# Patient Record
Sex: Male | Born: 1980 | Race: White | Hispanic: No | Marital: Married
Health system: Midwestern US, Academic
[De-identification: ages and names within clinical notes are randomized; demographics above are authoritative.]

---

## 2013-08-27 IMAGING — CR CHEST
2 series · 2 of 2 positions shown · non-contrast
Comparison: February 26, 2012
COMPARISON: February 26, 2012

------------- REPORT [id number] -------------
CHEST 2 VIEWS
INDICATION: Chest pain and palpitations
TECHNIQUE: PA and lateral projections.

[chest pa x-wise]
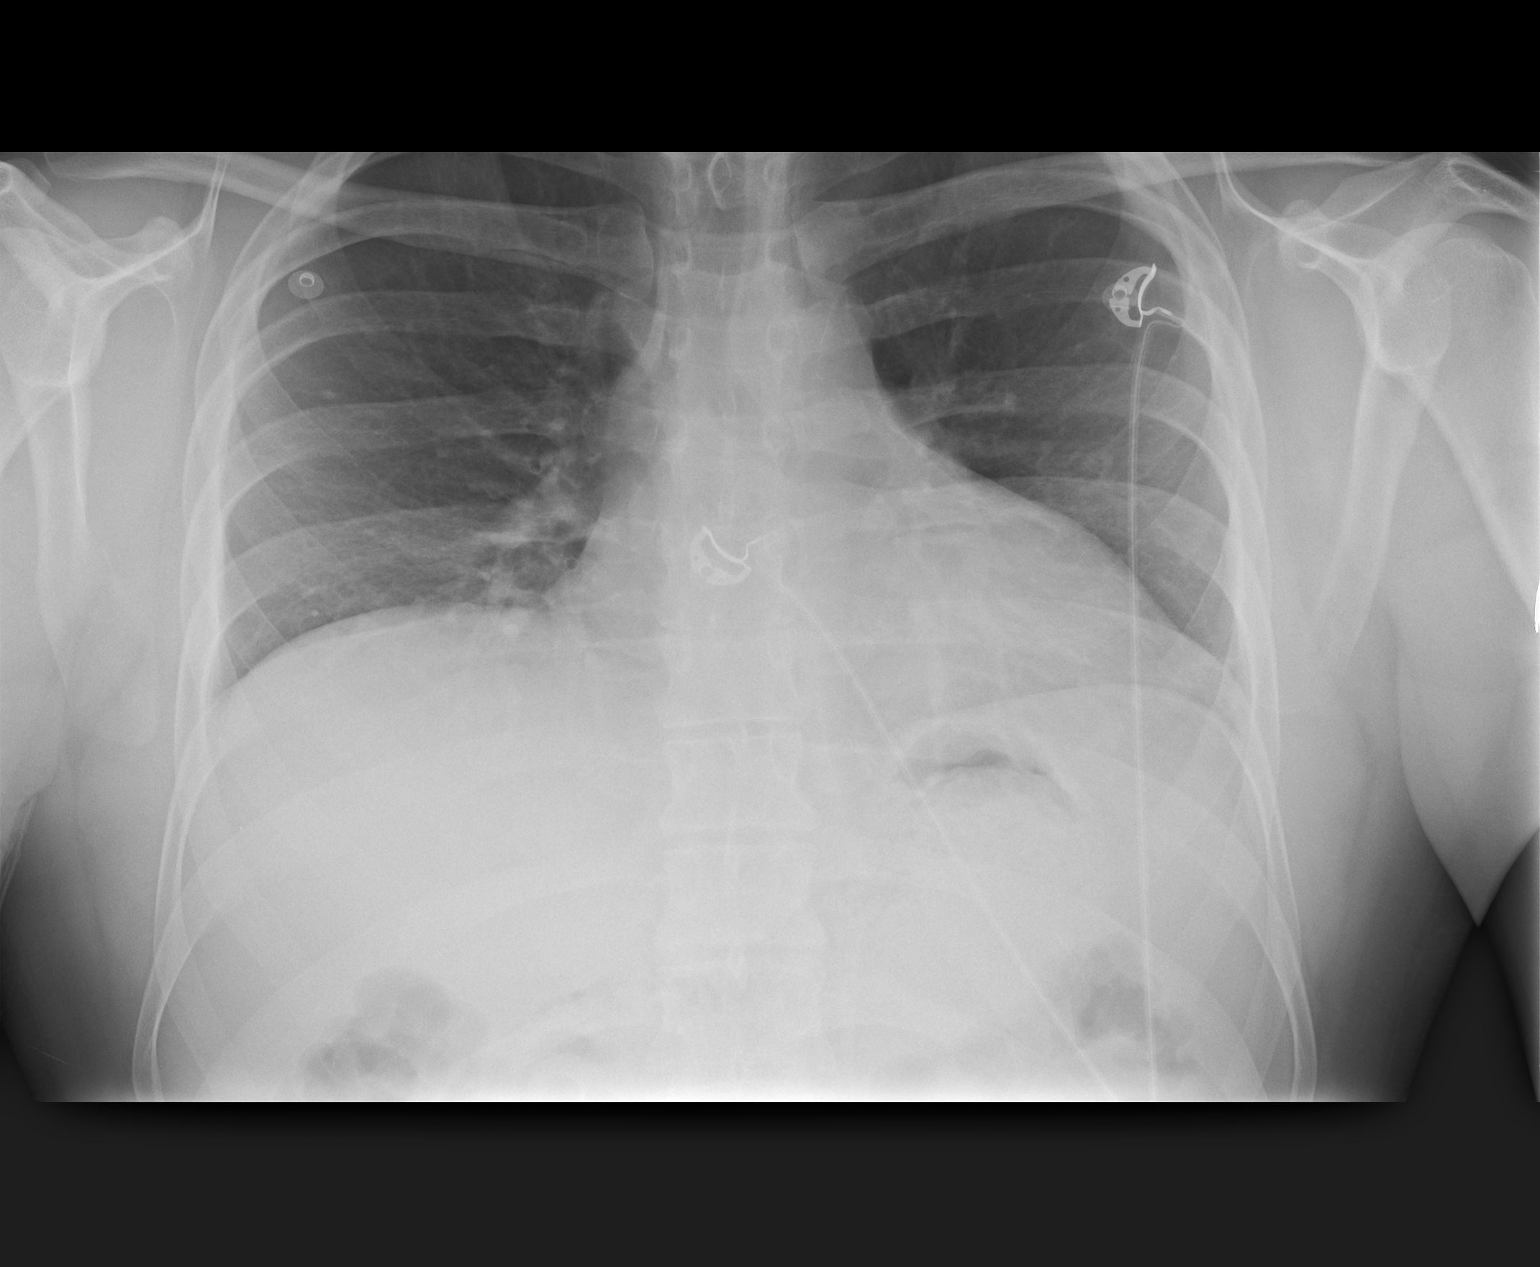

[chest lat]
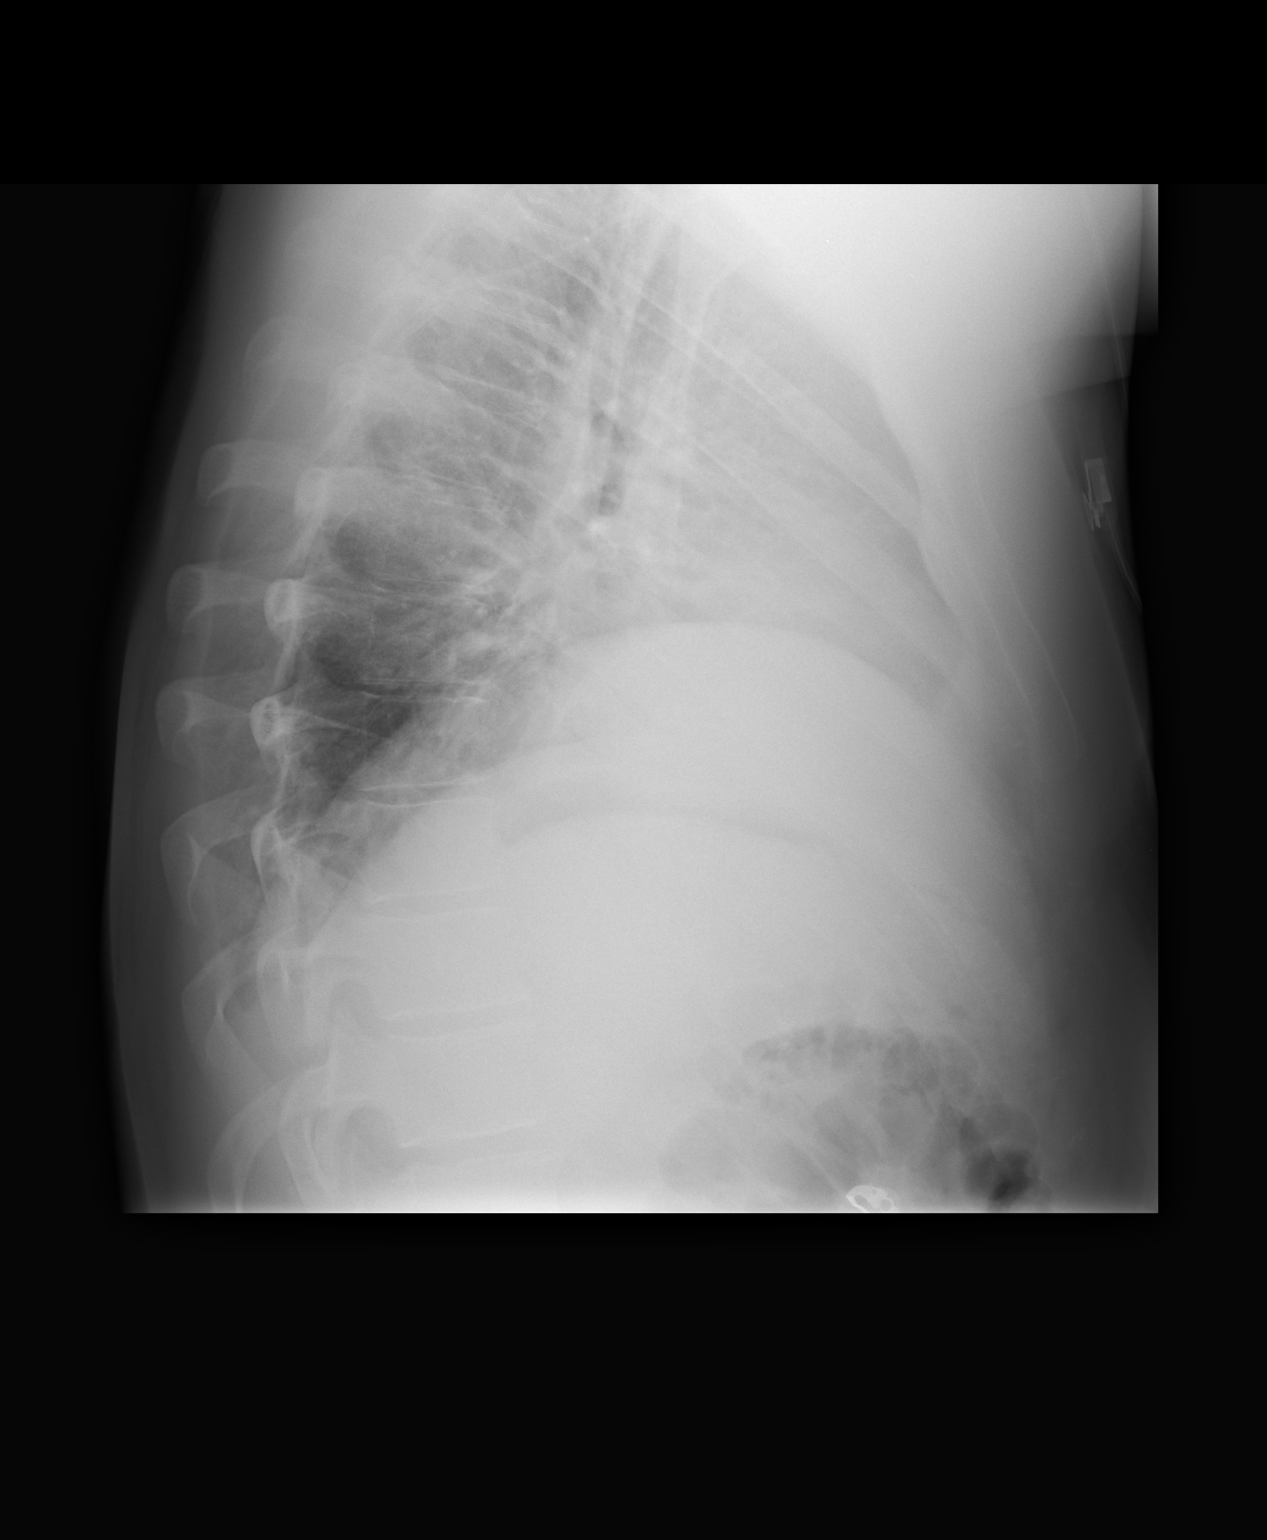

[2 of 2 positions shown; findings below may reference images not displayed]

FINDINGS: There is very shallow inspiration.
The lung fields reveal no active process..
The heart and mediastinum are physiologic in size and contour
IMPRESSION: No active process

Tech Notes: PT STATES HAS CHEST PAIN, SOA X 1 DAY. HX OF TACHYCARDIA, MEDS STARTED X 1 WKS AGO. PT
SHIELDED. TJ/ME

------------- REPORT [id number] -------------
CHEST 2 VIEWS
FINDINGS: There is very shallow inspiration.
The lung fields reveal no active process..
The heart and mediastinum are physiologic in size and contour
IMPRESSION: No active process

Tech Notes: PT STATES HAS CHEST PAIN, SOA X 1 DAY. HX OF TACHYCARDIA, MEDS STARTED X 1 WKS AGO. PT
SHIELDED. TJ/ME

## 2013-12-17 IMAGING — CR CHEST
1 series · 1 of 1 positions shown · non-contrast
Comparison: none

EXAM:  FRONTAL VIEW CHEST
HISTORY: Hypertension.

[chest pa x-wise]
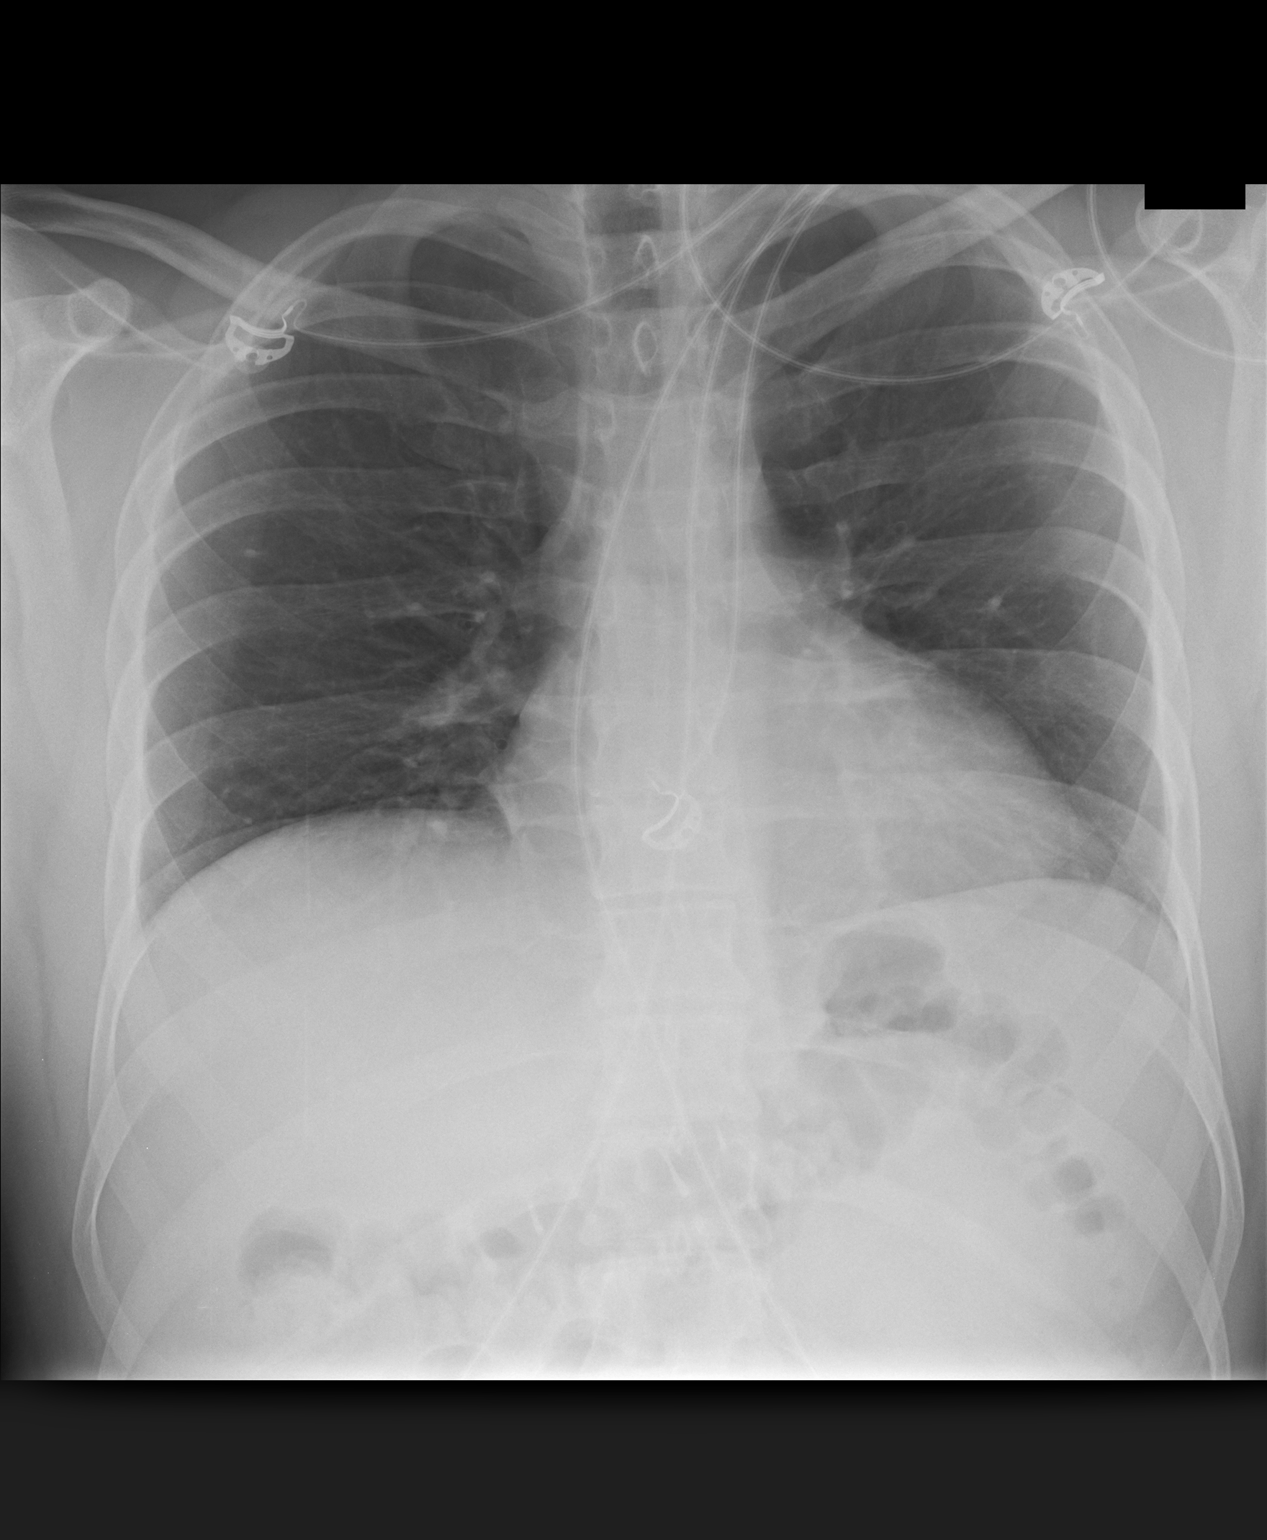

[1 of 1 positions shown; findings below may reference images not displayed]

FINDINGS: Examination 17 December, 0216 hours.

Normal cardiac silhouette and pulmonary vasculature.  Lungs well-aerated and
well-inflated.
IMPRESSION: Negative.

Job:  [id number]

Tech Notes: PT. C/O BLURRED VISION ACCOMPANIED BY HEADACHE X3 DAYS, HYPERTENSION, MK

## 2014-03-06 IMAGING — CR UP_EXM
3 series · 3 of 3 positions shown · non-contrast
Comparison: None available

EXAM: Right hand
INDICATION: Right hand swelling in the region of the first MCP joint.
No known trauma
TECHNIQUE: Three standard projections.

[hand lat]
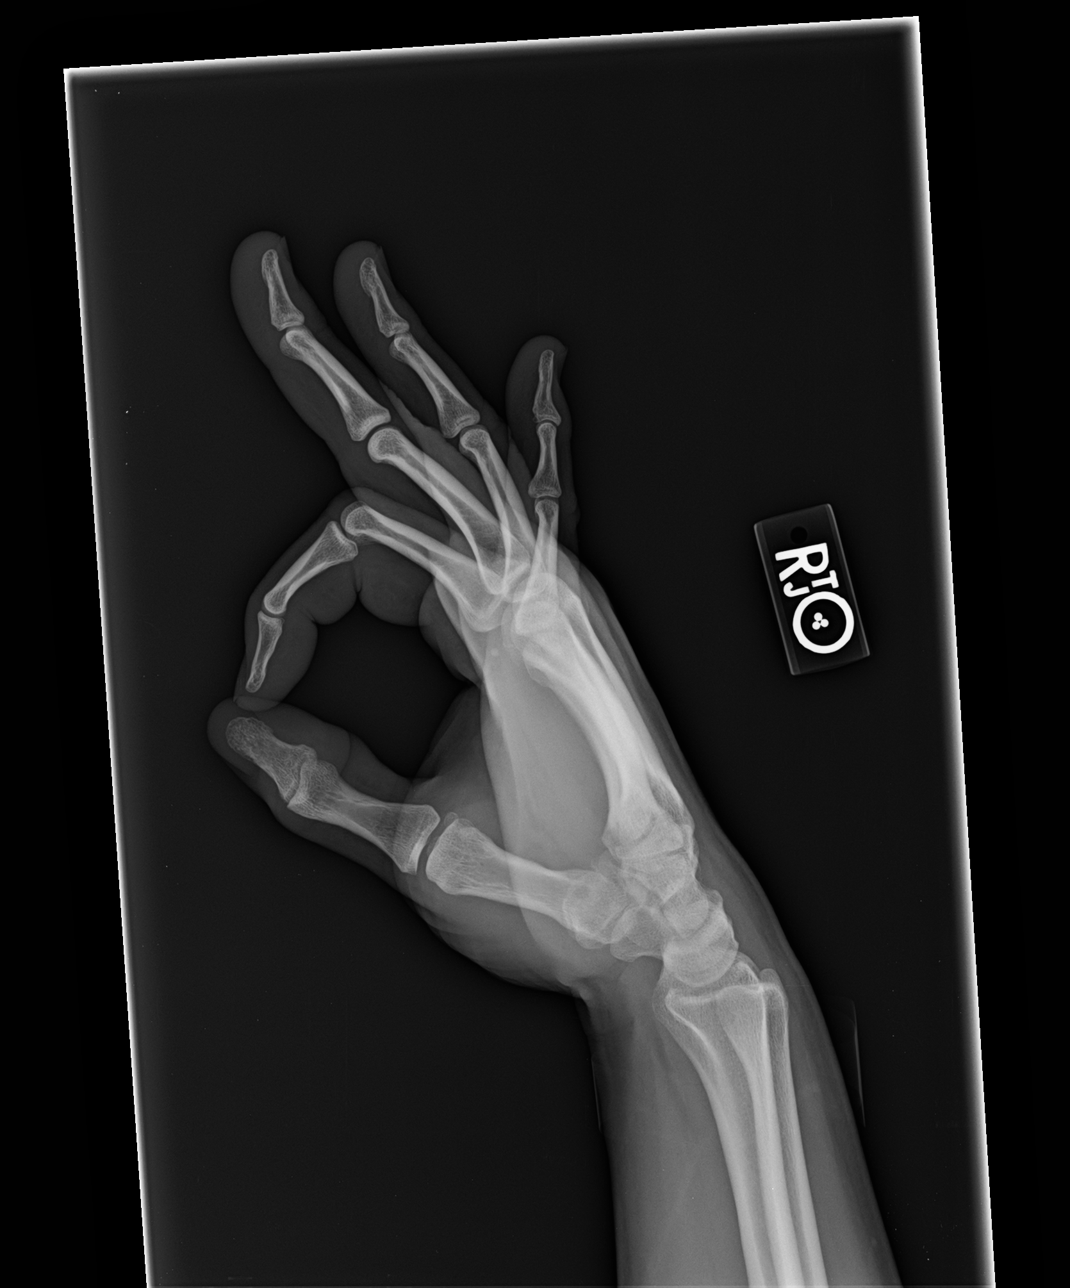

[hand obl]
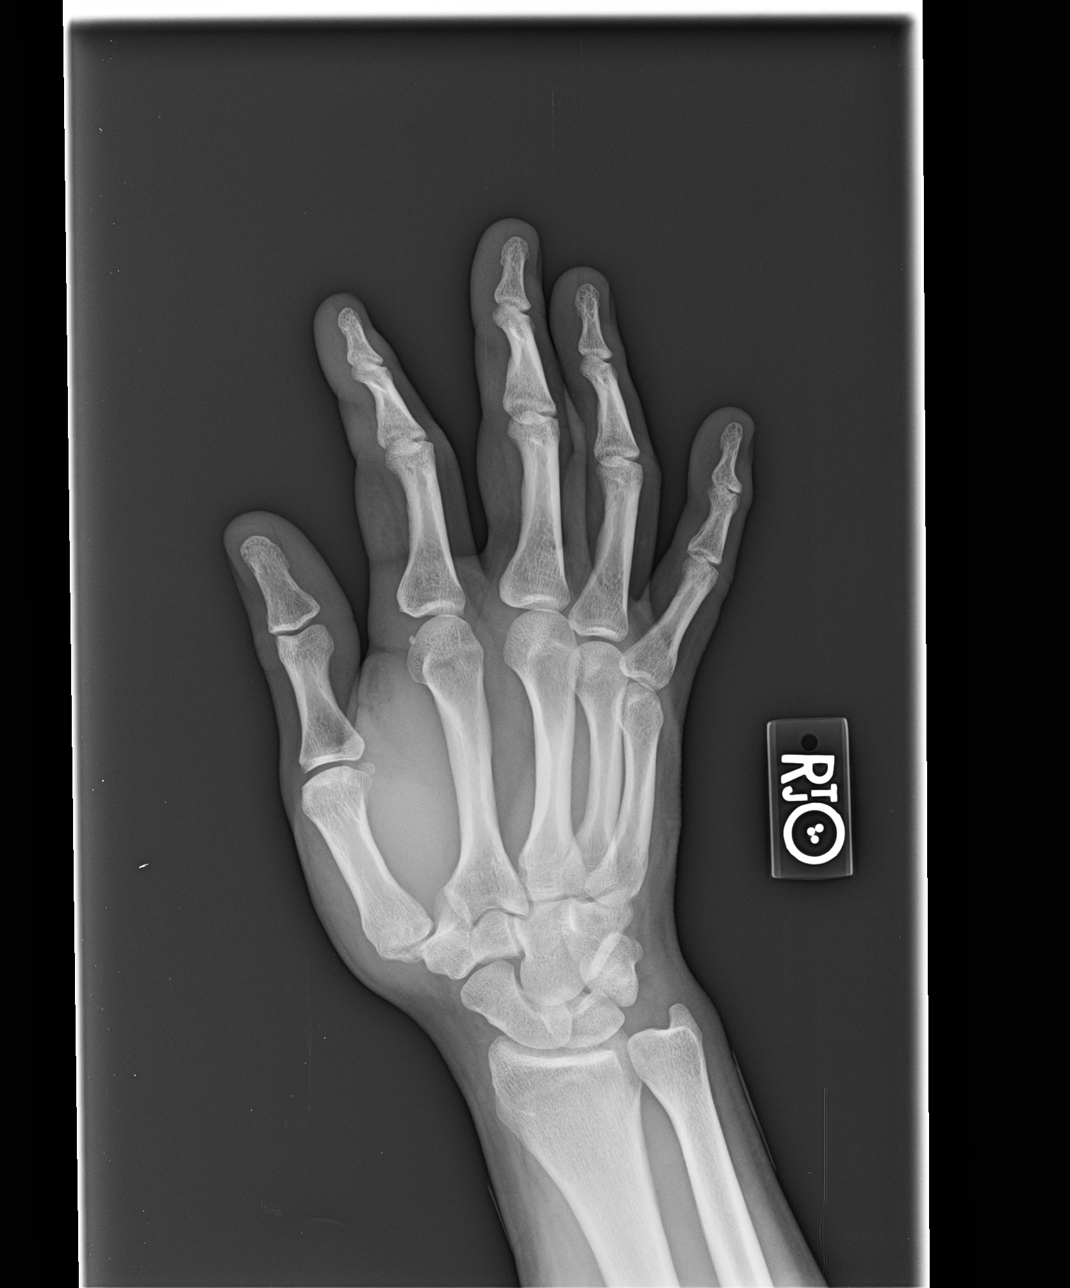

[hand]
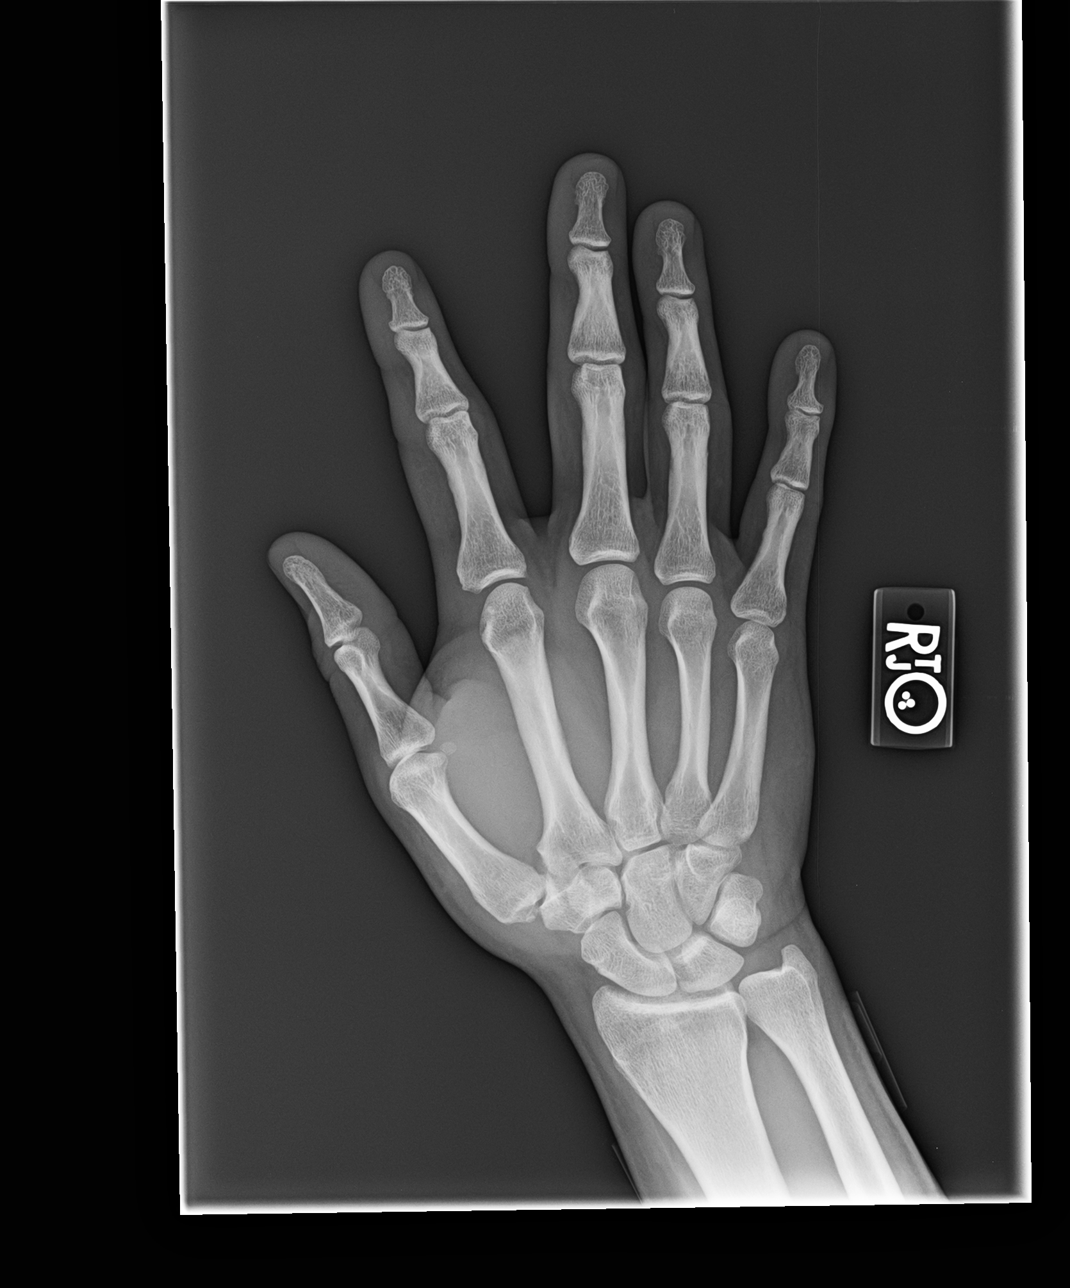

[3 of 3 positions shown; findings below may reference images not displayed]

IMPRESSION: Soft tissue thickening of the thenar eminence and the bases of the first and
second digits.
Mild arthrosis of the first metacarpal trapezium articulation.
No acute bony abnormality
FINDINGS: There is no fracture or dislocation.
There is mild lipping of the proximal inferior surface of the radius.
There is soft tissue thickening of the thenar eminence and bases of the first
and second digits
No other significant localized bony changes seenreveal no focal abnormality

y

Tech Notes: PT C/O RIGHT HAND, 1ST MCP SWELLING X LAST PM NO KNOWN INJURY. TJ

## 2014-05-06 IMAGING — CR LOW_EXM
2 series · 2 of 2 positions shown · non-contrast
Comparison: None

EXAM: Left foot
INDICATION: Lateral with meter 2 years
TECHNIQUE: AP and lateral projections.

[foot]
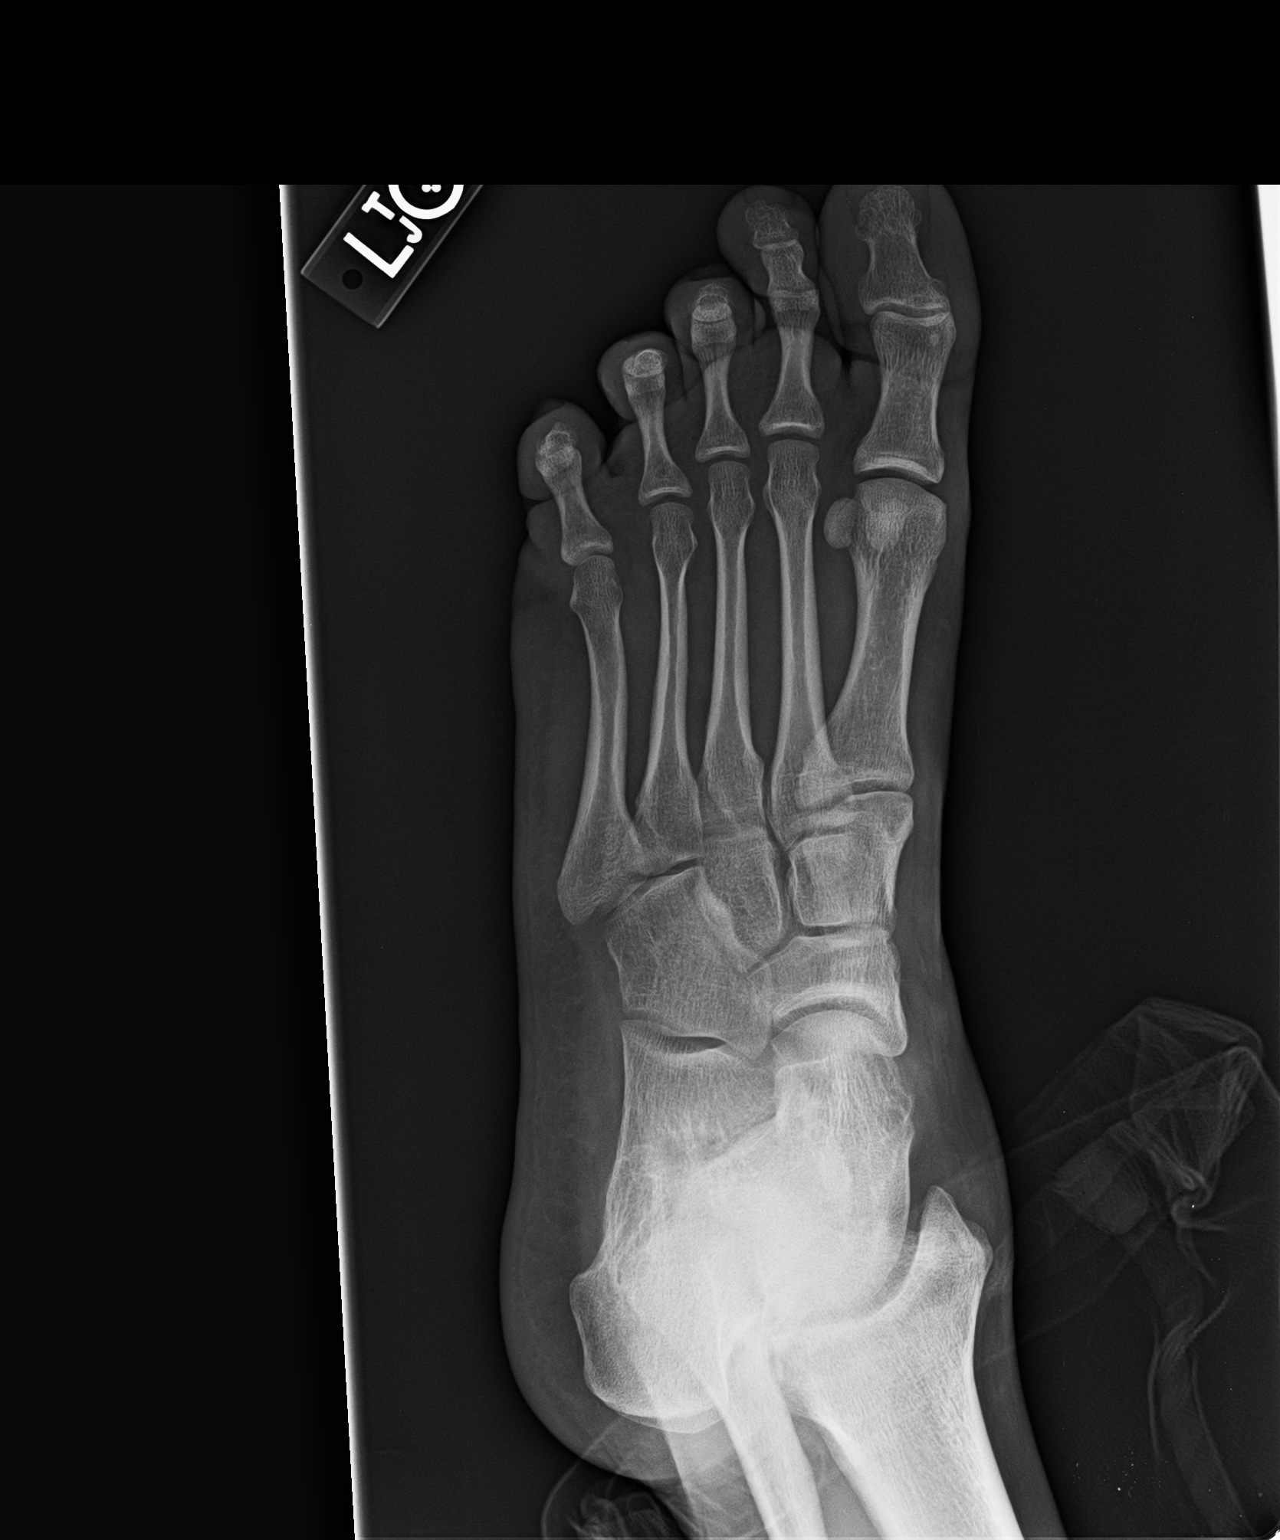

[foot lat]
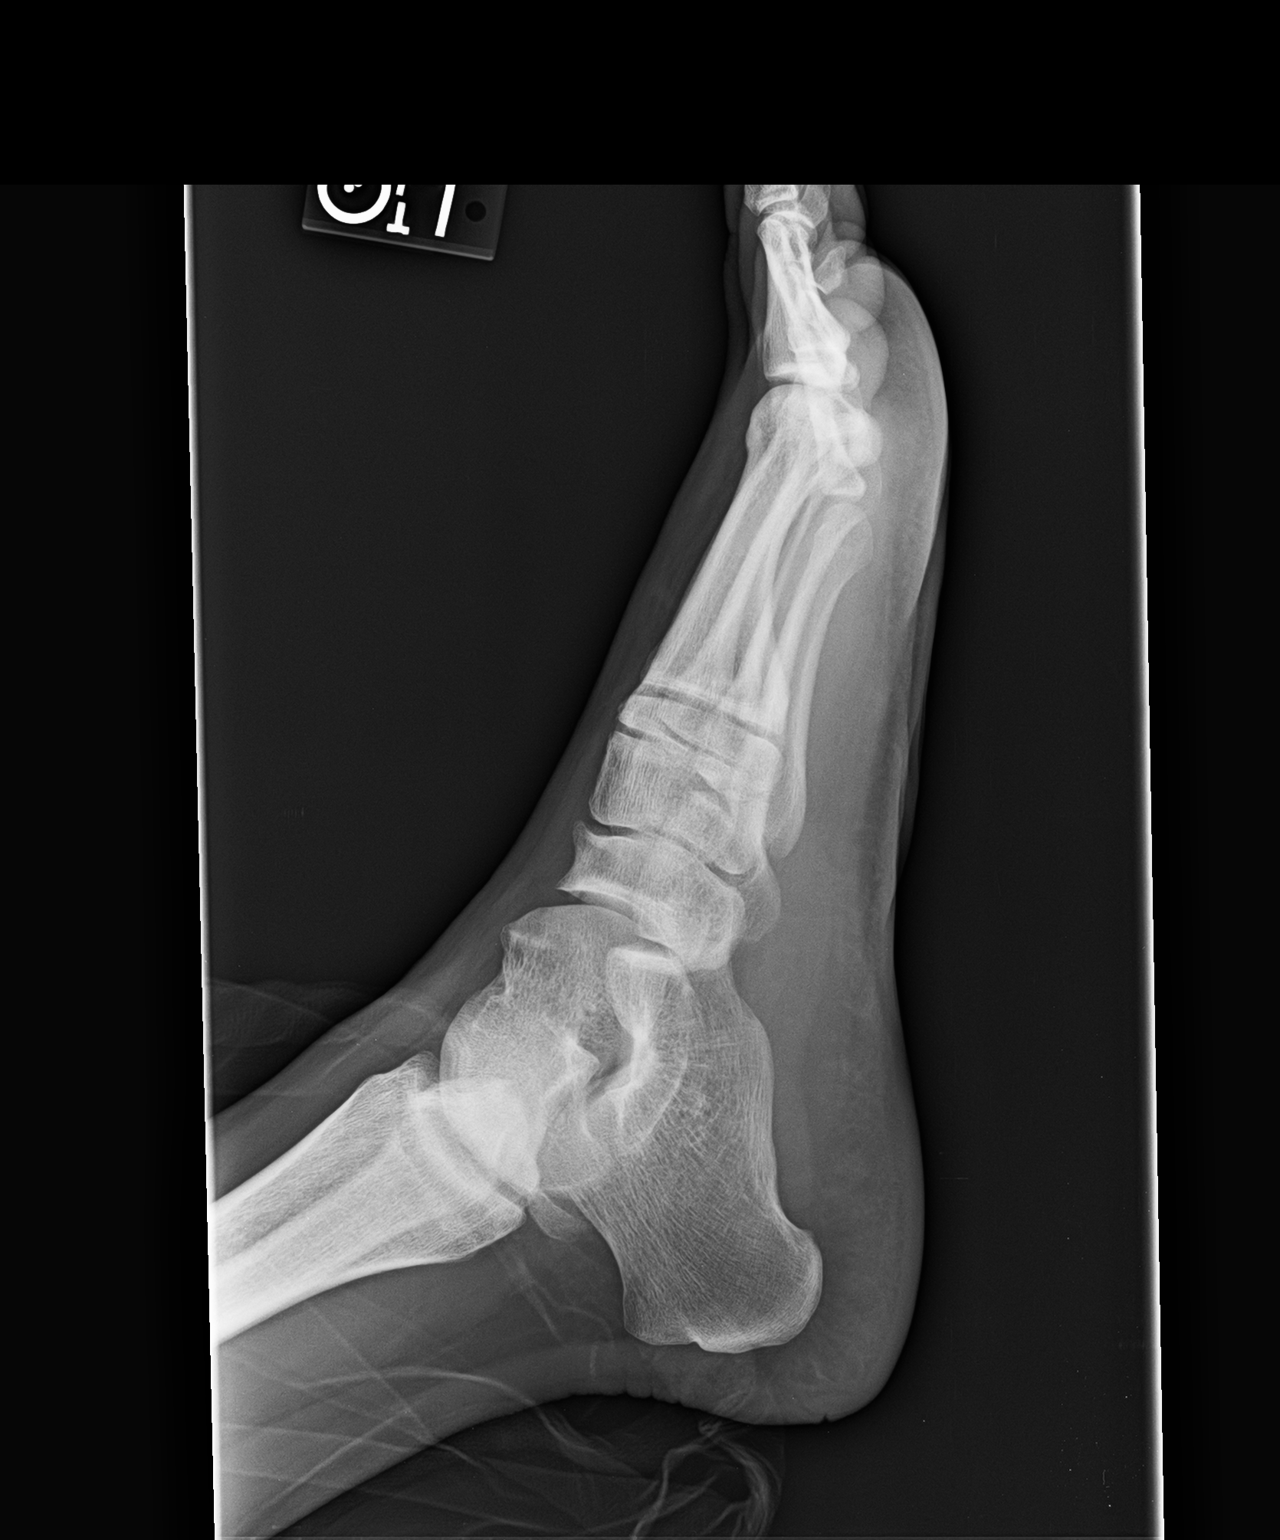

[2 of 2 positions shown; findings below may reference images not displayed]

IMPRESSION: No acute bony process.
FINDINGS: There is no fracture or dislocation.
There are no significant localized bony change.
The joint spaces are preserved.
The soft tissue silhouettes reveal no focal abnormality

Tech Notes: PT STATES HAS LEFT LATERAL FOOT PAIN X 2 YEARS. INCREASING PAIN. NO KNOWN INJURY. PT
SHIELDED. TJ

## 2015-02-14 IMAGING — CT Abdomen^1_KIDNEY_STONE (Adult)
1 series · 15 of 32 positions shown, 19 images · non-contrast
Comparison: none

PROCEDURE: ABDOMEN 1_KIDNEY_STONE (ADULT)
HISTORY: Left flank pain that radaites into left groin region x 3 days- nausea- Pt denies vomiting
and
diarrhea- DLP [id number]
TECHNIQUE: Noncontrast CT images of the abdomen and pelvis with additional sagittal and coronal
reformatted views. Prior study not available for comparison.

[Series 2: kidney stone 3.0 soft tissue · axial · 0.88mm/px · z∈[-534,-42]mm · 15 of 182 slices shown, 19 images]
[im 12/182  soft-tissue]
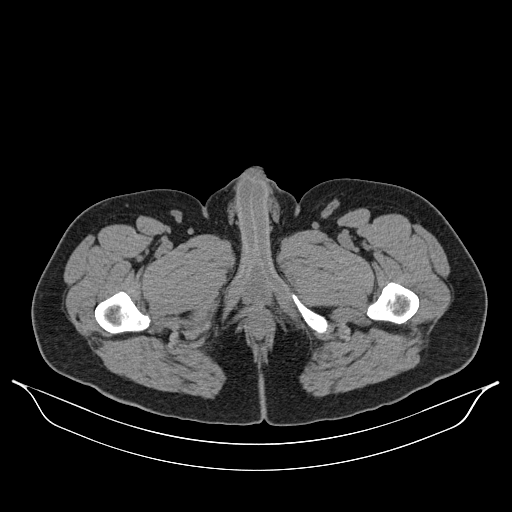
[im 12/182  bone]
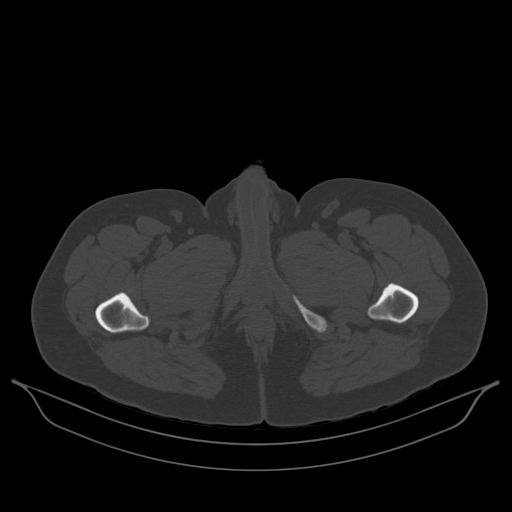
[im 24/182  soft-tissue]
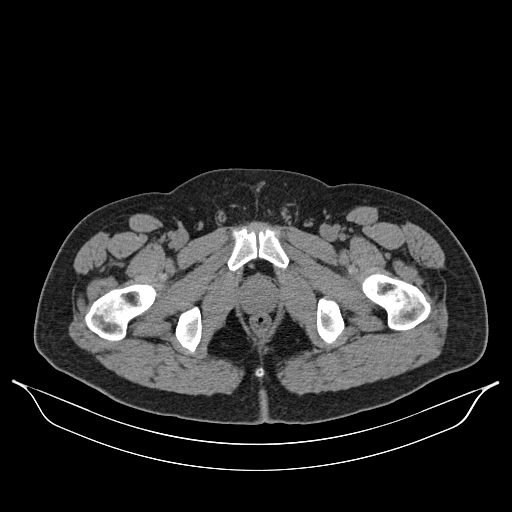
[im 36/182  soft-tissue]
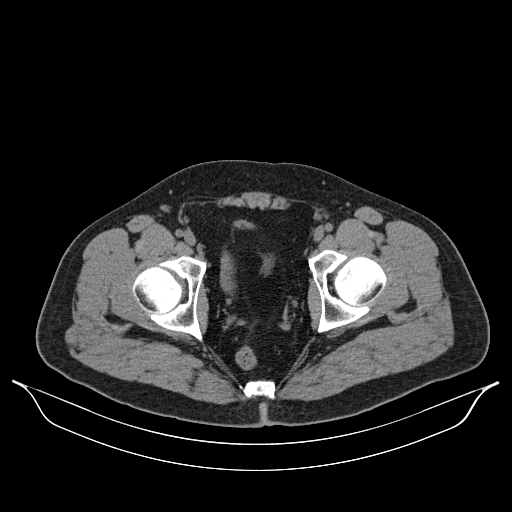
[im 53/182  soft-tissue]
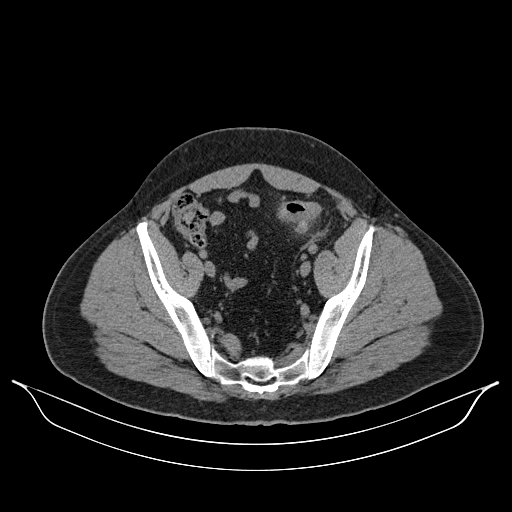
[im 65/182  soft-tissue]
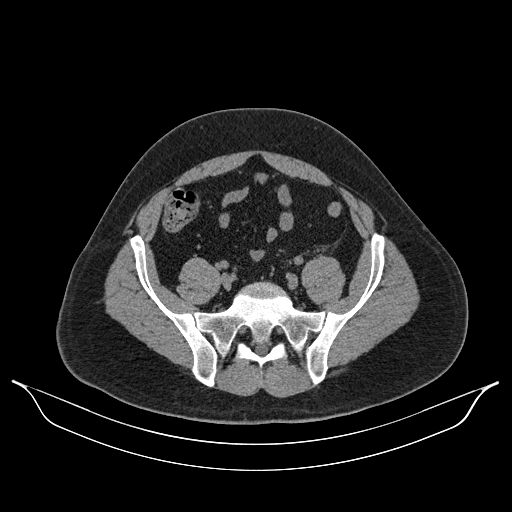
[im 76/182  soft-tissue]
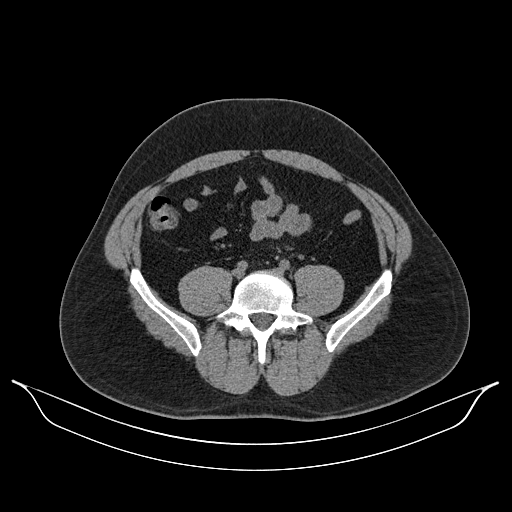
[im 94/182  soft-tissue]
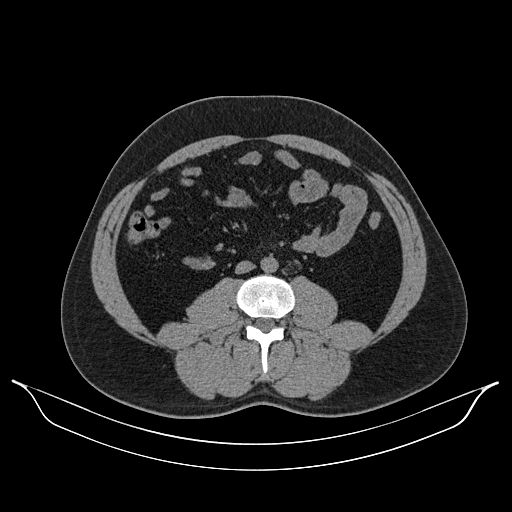
[im 106/182  soft-tissue]
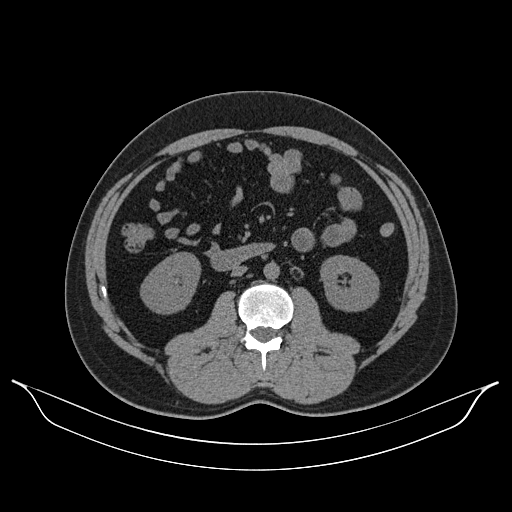
[im 117/182  soft-tissue]
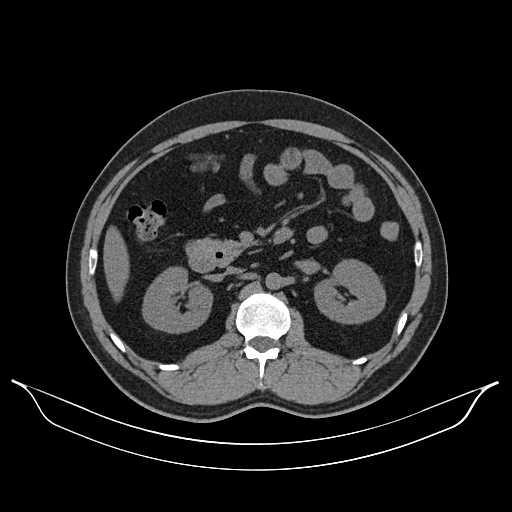
[im 117/182  bone]
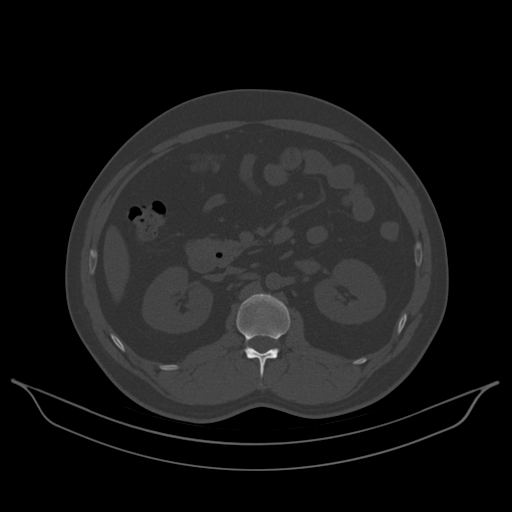
[im 129/182  soft-tissue]
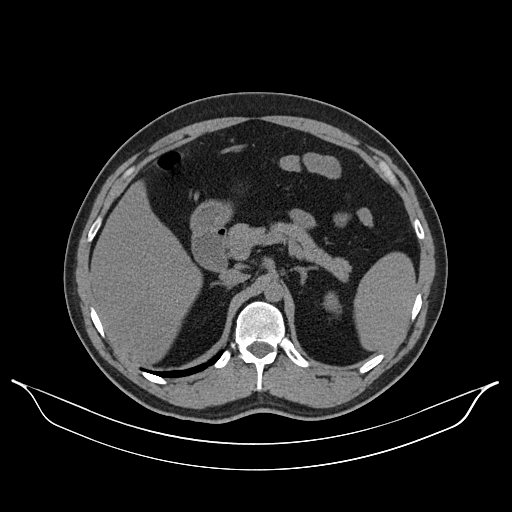
[im 146/182  soft-tissue]
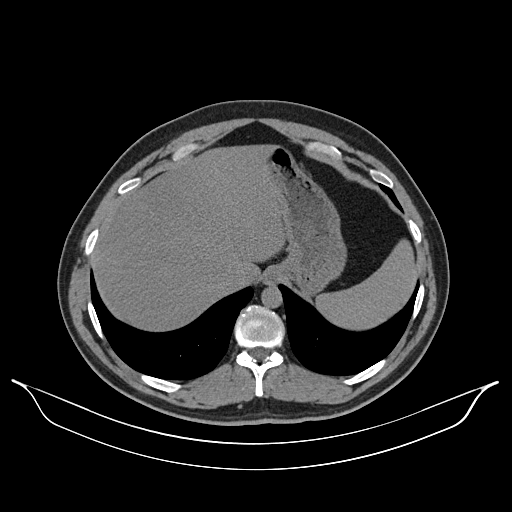
[im 158/182  soft-tissue]
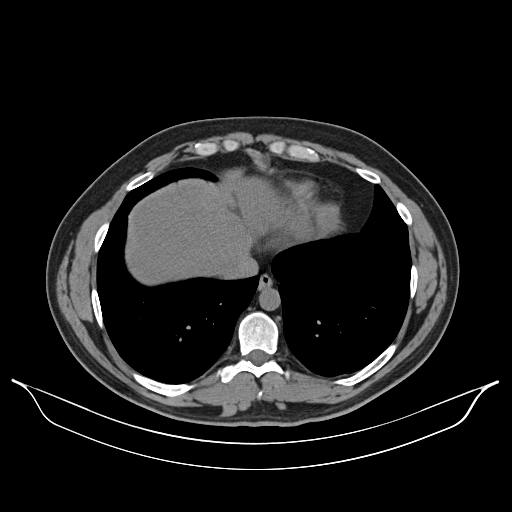
[im 158/182  lung]
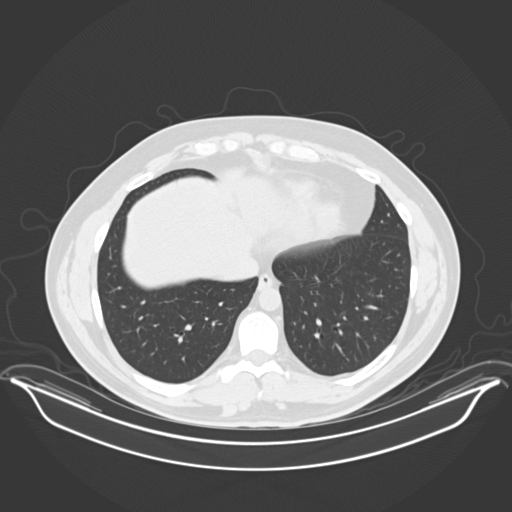
[im 164/182  lung]
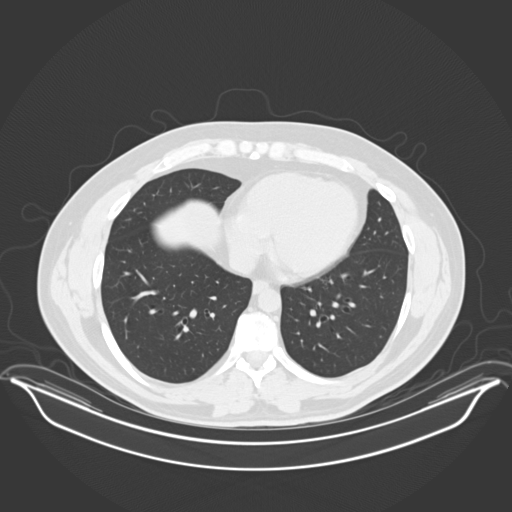
[im 170/182  soft-tissue]
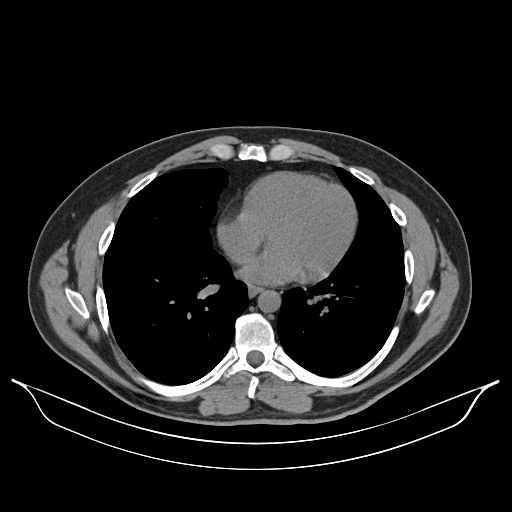
[im 170/182  lung]
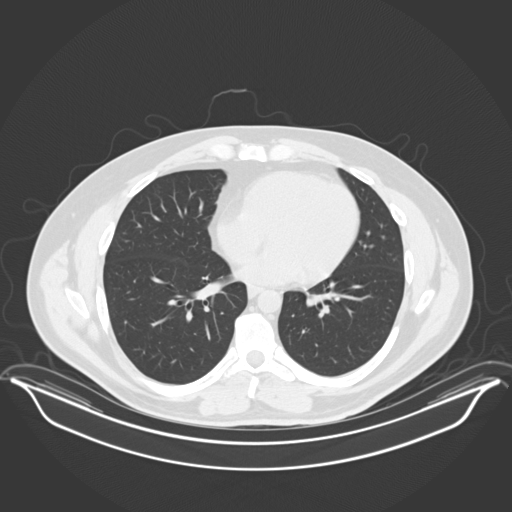
[im 176/182  lung]
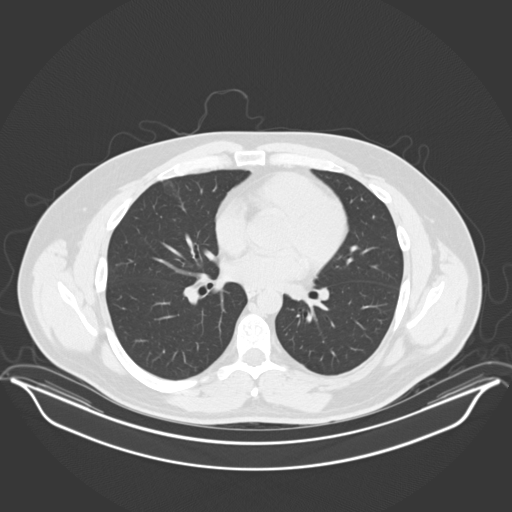

[15 of 32 positions shown; findings below may reference images not displayed]

FINDINGS: The visualized lung bases are clear.
Liver diffusely decreased in attenuation. The noncontrast appearance of the spleen, pancreas,
gallbladder, and adrenal glands is unremarkable.
Kidneys unremarkable without stone disease or hydronephrosis. Ureters normal in caliber.
Solid organ mass would be difficult to exclude without contrast.
There is no abdominal or pelvic lymphadenopathy or ascites.
Short segment of proximal sigmoid with wall thickening and surrounding inflammatory stranding,
diverticula noted in this location. Remaining bowel demonstrates no evidence of obstruction. No
mesenteric r mass. No free intraperitoneal gas.
Evaluation of the pelvis reveals a normal appearing urinary bladder.
There are no suspicious lytic or blastic osseous lesions.
IMPRESSION: 1. Findings of a mild uncomplicated sigmoid diverticulitis. No evidence of abscess or perforation.
2. Otherwise unremarkable noncontrast CT scan of the abdomen and pelvis.

Tech Notes: Left flank pain and groin region x 3 days- Nausea- Pt denies diarrhea and vomiting

## 2015-04-22 IMAGING — NM NM HEPATOBILIARY DUCTAL SYSTEM
1 series · 6 of 6 positions shown · non-contrast
Comparison: none

[flow · 4.52mm/px · 6 of 49 frames shown]
[frame 5/49]
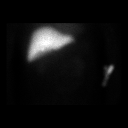
[frame 13/49]
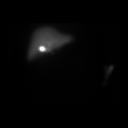
[frame 21/49]
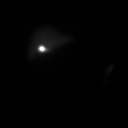
[frame 29/49]
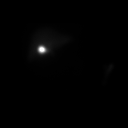
[frame 37/49]
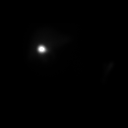
[frame 45/49]
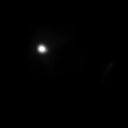

[6 of 6 positions shown; findings below may reference images not displayed]

NUCLEAR MEDICINE REPORT

EXAM
Hepatobiliary scan.

INDICATION
upper abd pain, diarrhea
5.3 mCi TcRRm MEBROFENIN @ 2639
1.8 micrograms KINEVAC-PT EXPERIENCED EPIGASTRIC PAIN AND NAUSEA DURING
INJECTION.
LLQ PAIN, DIARRHEA AND METALLIC TASTE IN HIS MOUTH X 2 MONTHS. ELEVATED
BILIRUBIN AND LIVER ENZYMES.(..)

FINDINGS
Prompt hepatic extraction and excretion of radiopharmaceutical. The gallbladder is initially
imaged at 4 minutes and continues to fill over the remaining hour. The gallbladder ejection
fraction is normal and calculated at 92 percent at 28 minutes.

IMPRESSION
Normal hepatobiliary scan and gallbladder function.

## 2015-04-22 IMAGING — US US ABDOMINAL COMPLETE
1 series · 14 of 25 positions shown · non-contrast
Comparison: none

[Series 1: us abdominal complete · 14 of 64 slices shown]
[im 1/64]
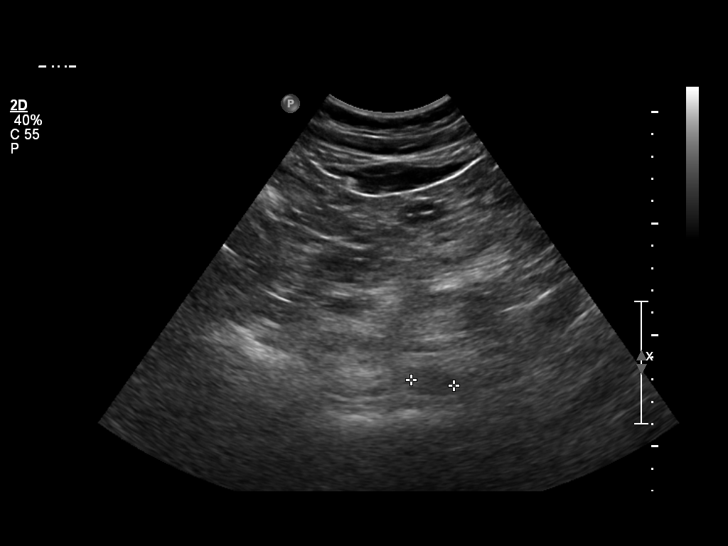
[im 6/64]
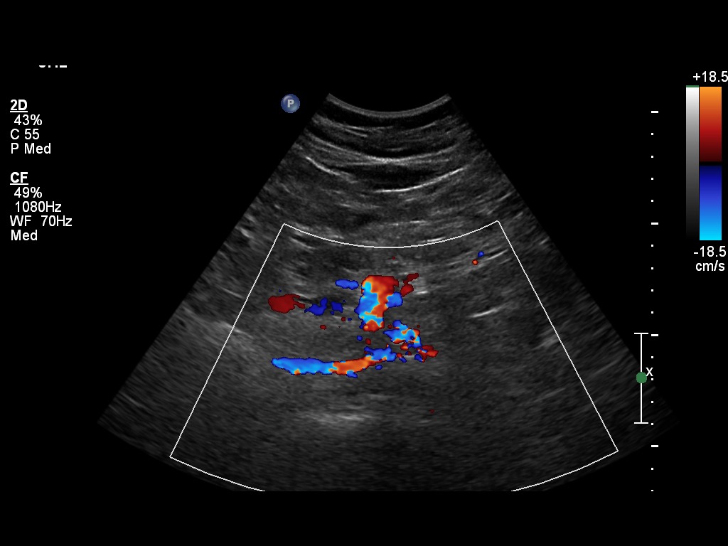
[im 11/64]
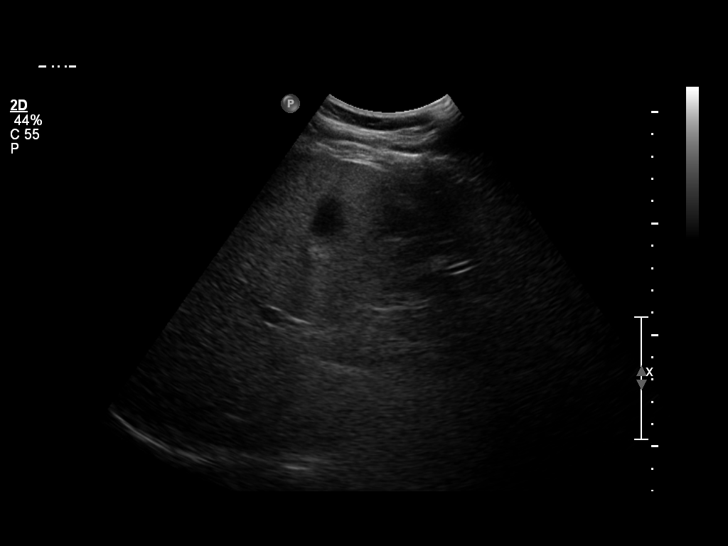
[im 16/64]
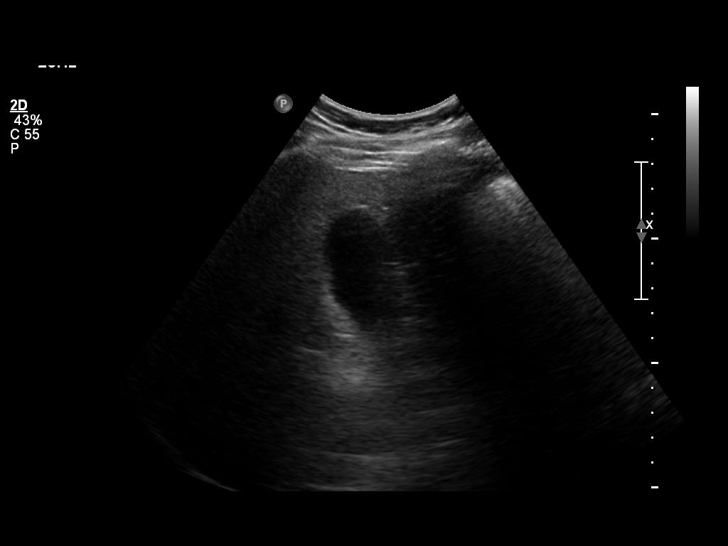
[im 22/64]
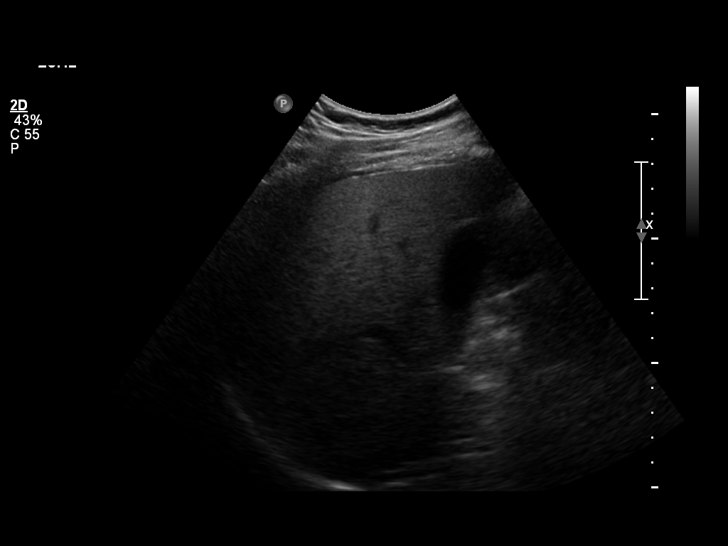
[im 24/64]
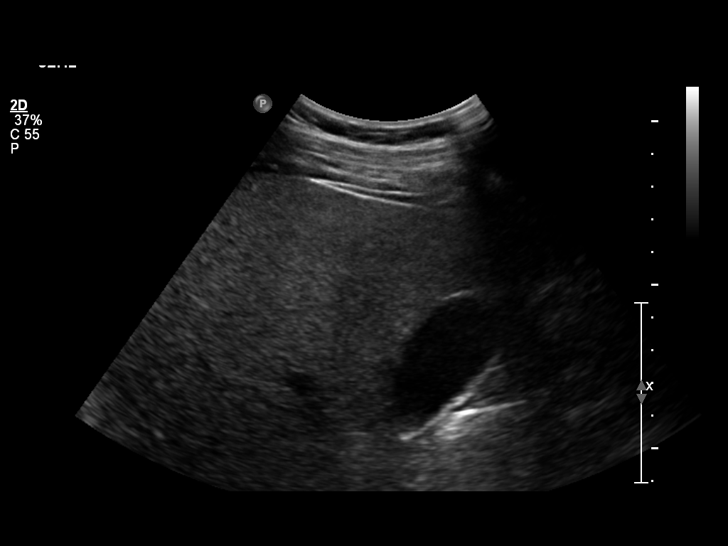
[im 29/64]
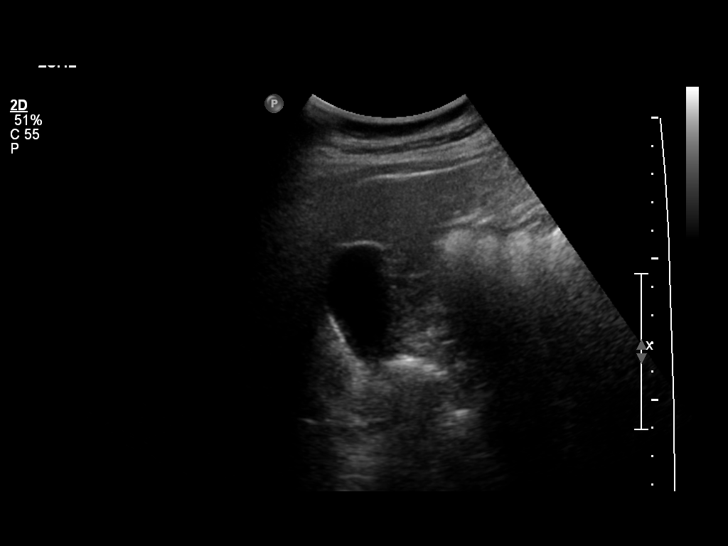
[im 35/64]
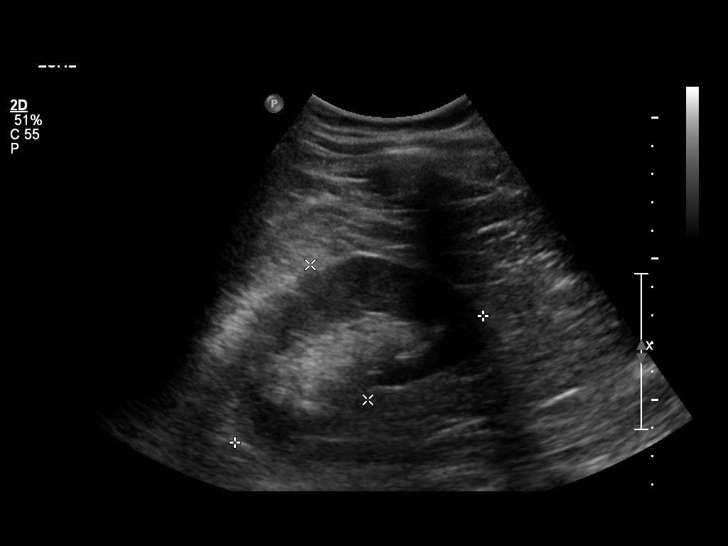
[im 40/64]
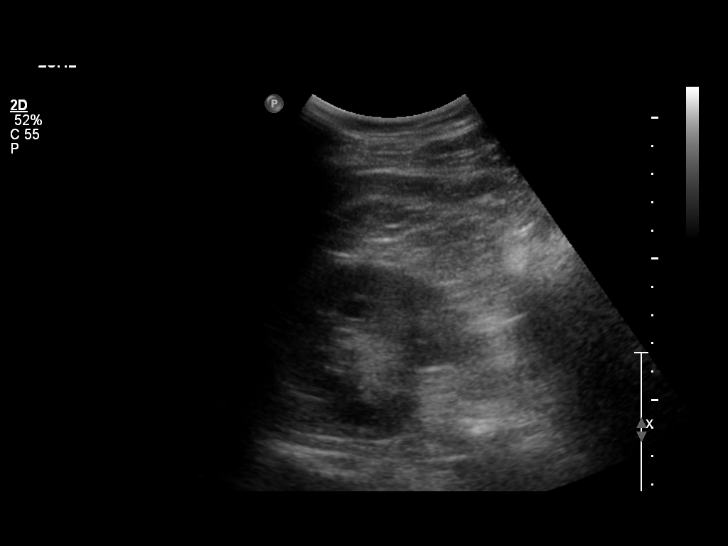
[im 43/64]
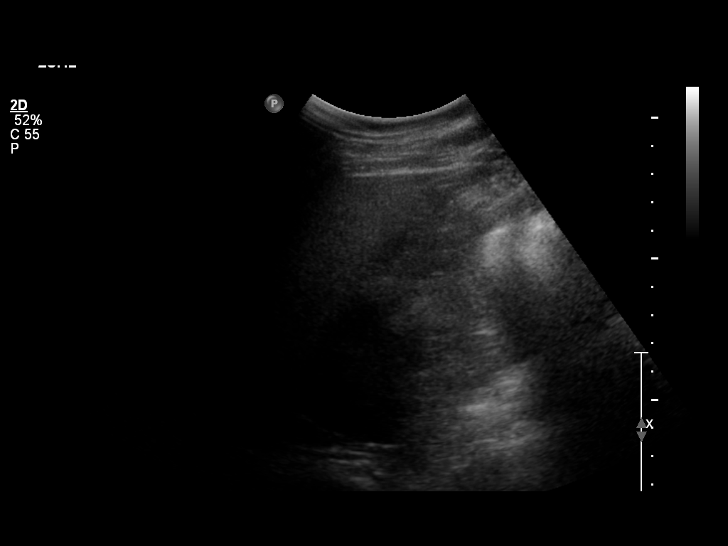
[im 48/64]
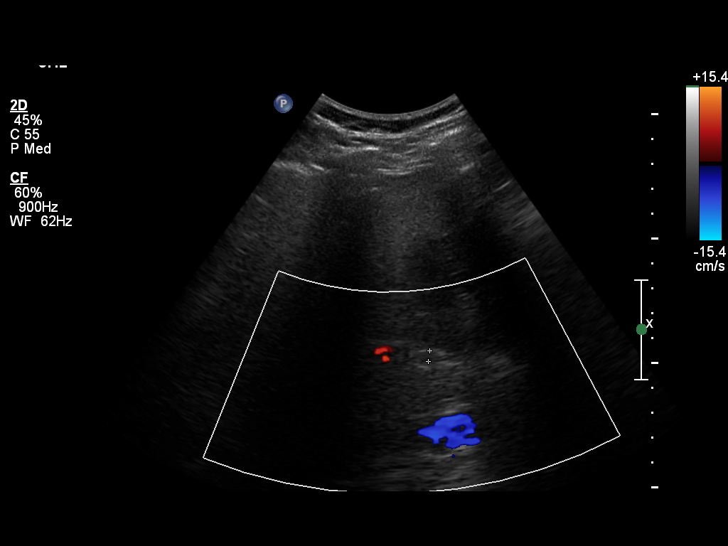
[im 53/64]
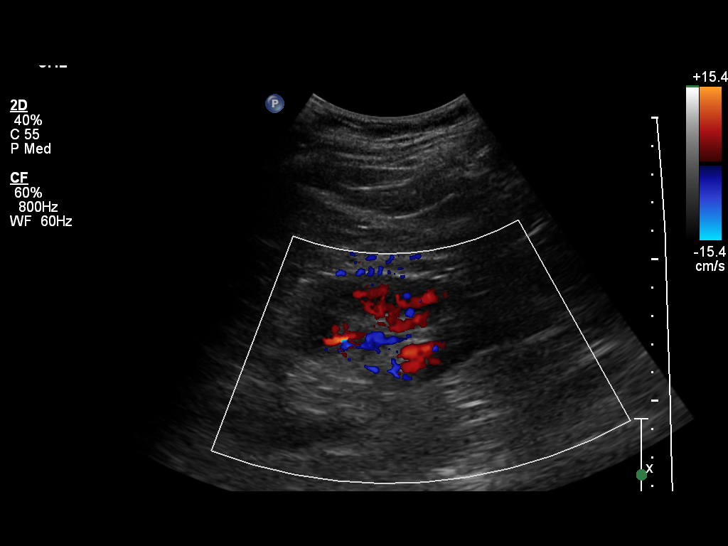
[im 58/64]
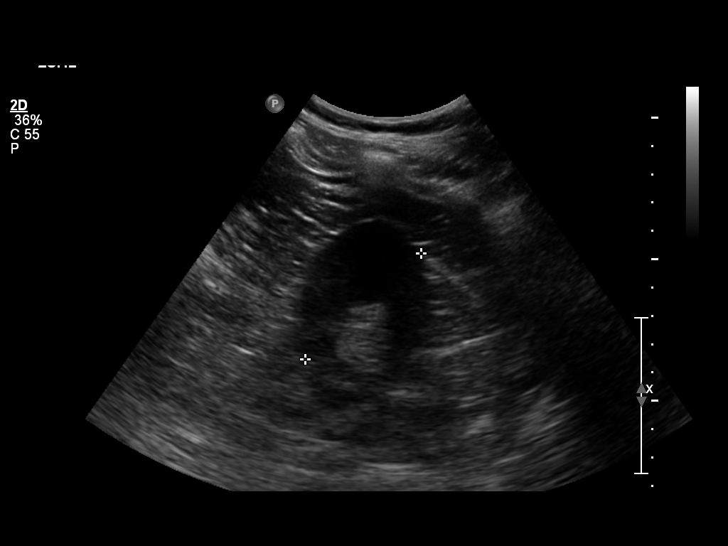
[im 64/64]
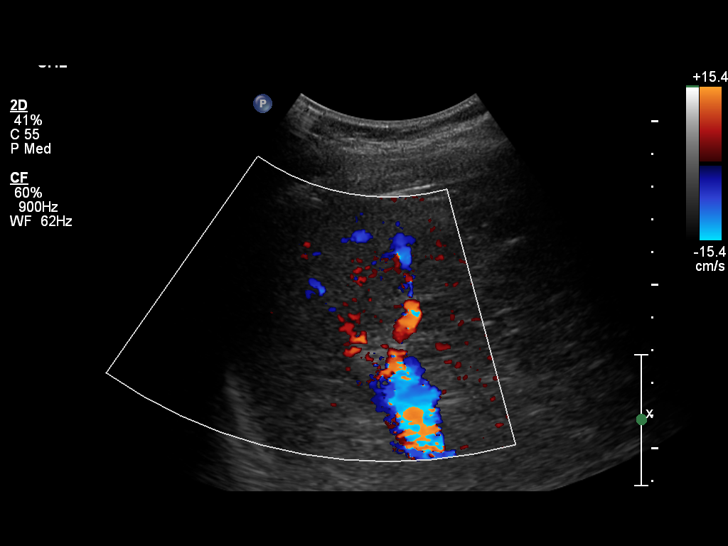

[14 of 25 positions shown; findings below may reference images not displayed]

ULTRASOUND REPORT

DIAGNOSTIC STUDIES

EXAM
Abdomen ultrasound.

INDICATION
upper abd pain, diarrhea

TECHNIQUE
High-resolution grayscale and color Doppler interrogation of the abdomen.

COMPARISON
CT abdomen and pelvis 14 February, 2015.

FINDINGS
The IVC is patent. There is no gross dilatation of the abdominal aorta, though it is not well
visualized due to overlying bowel gas.
The gallbladder wall is not thickened. No pericholecystic fluid or cholelithiasis. The common bile
duct is normal measuring 4 millimeters in diameter.
The liver measures 14.6 centimeters in length and is homogeneous in echogenicity without
intrahepatic biliary ductal dilatation or mass. Normal echotexture is noted.
The pancreas is obscured by overlying bowel gas and poorly evaluated.
The spleen measures 10.3 centimeters in length and shows no evidence of inhomogeneity.
The right kidney measures 9.8 x 5.2 x 5.5 centimeters and the left kidney measures 10.1 x 5.9 x
5.6 centimeters. No hydronephrosis. Normal corticomedullary differentiation and parenchymal
thickness.

IMPRESSION
No acute abdominal pathology.

## 2015-05-08 IMAGING — CT Abdomen^1_ABDOMEN_PELVIS_WITH (Adult)
1 series · 15 of 32 positions shown, 19 images · non-contrast
Comparison: none

[Series 2: abd/pelvis with 5.0 soft tissue · axial · 0.79mm/px · z∈[-464,-34]mm · 15 of 94 slices shown, 19 images]
[im 7/94  soft-tissue]
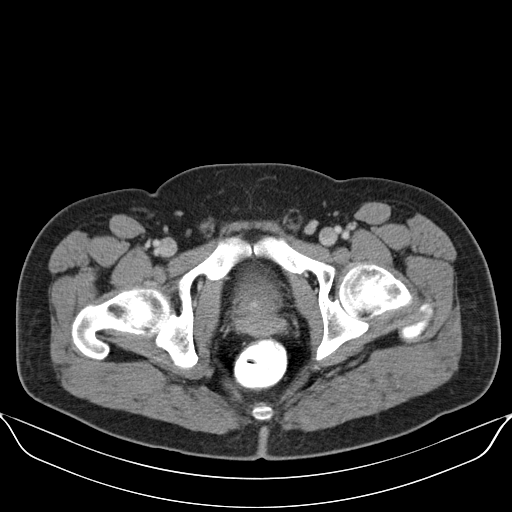
[im 7/94  bone]
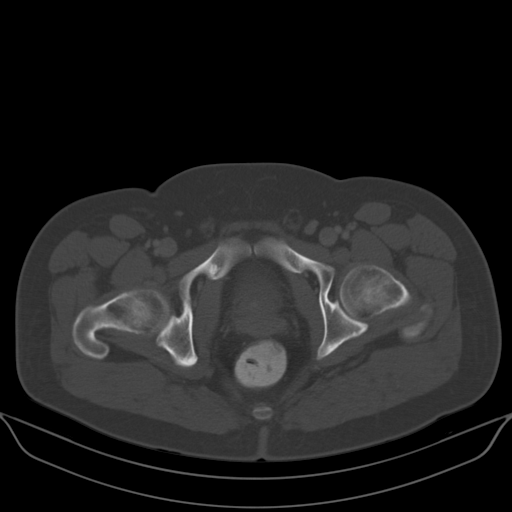
[im 13/94  soft-tissue]
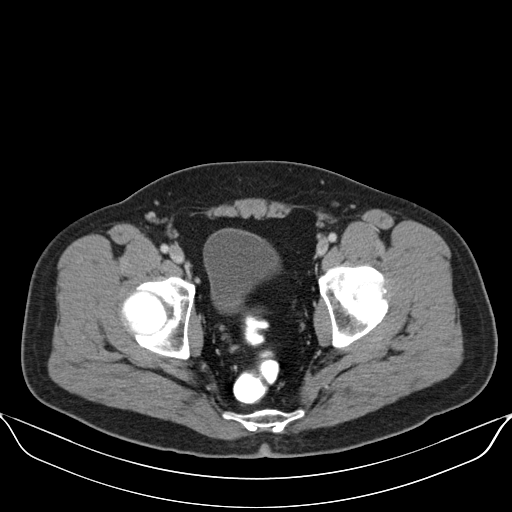
[im 19/94  soft-tissue]
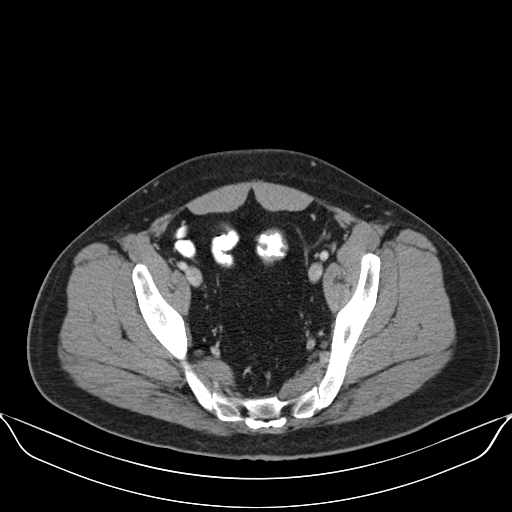
[im 28/94  soft-tissue]
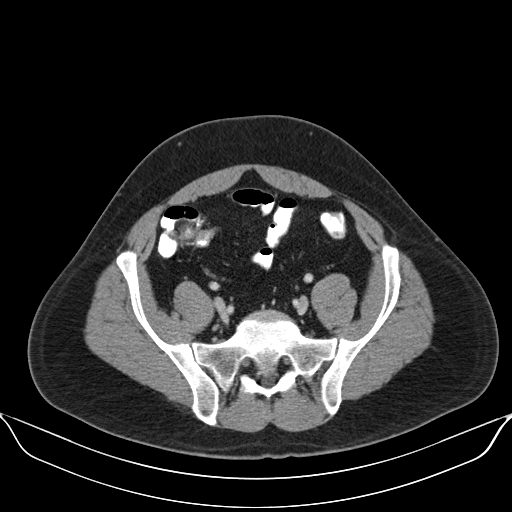
[im 34/94  soft-tissue]
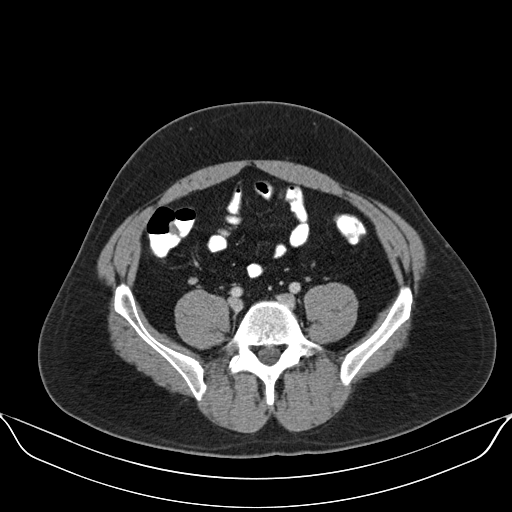
[im 40/94  soft-tissue]
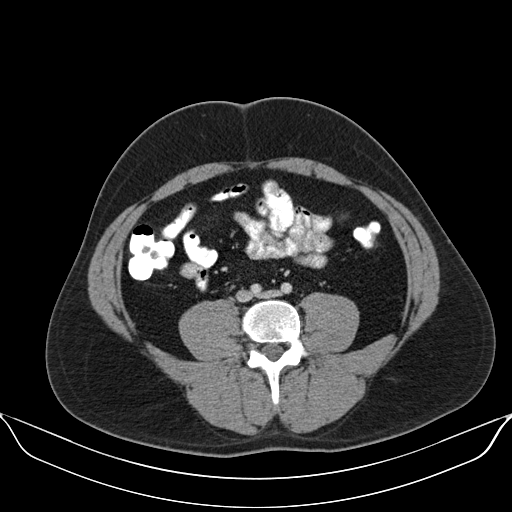
[im 49/94  soft-tissue]
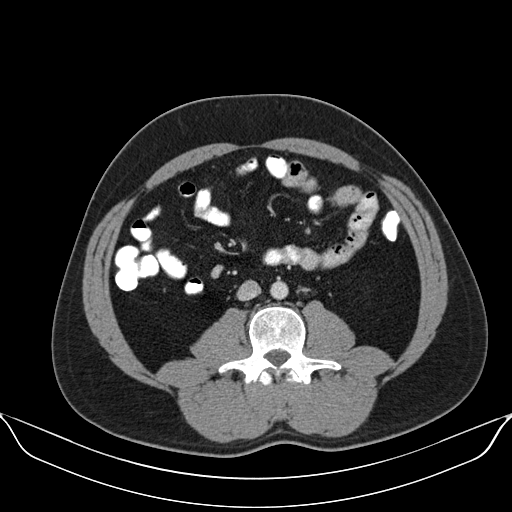
[im 55/94  soft-tissue]
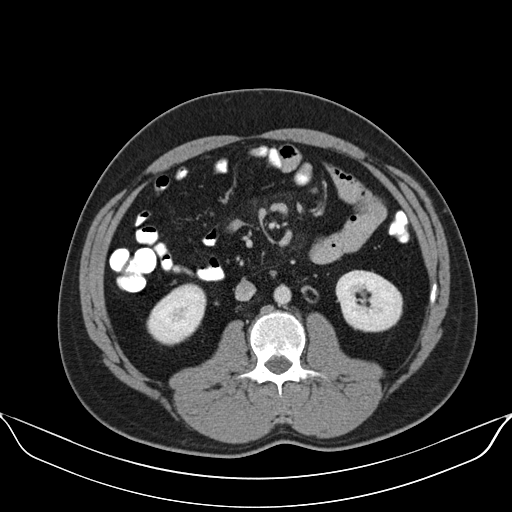
[im 61/94  soft-tissue]
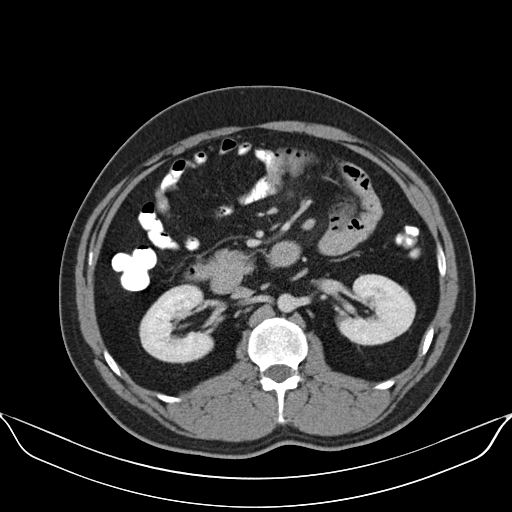
[im 61/94  bone]
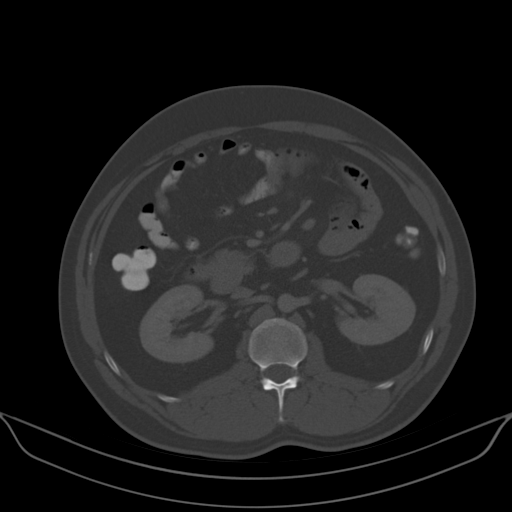
[im 67/94  soft-tissue]
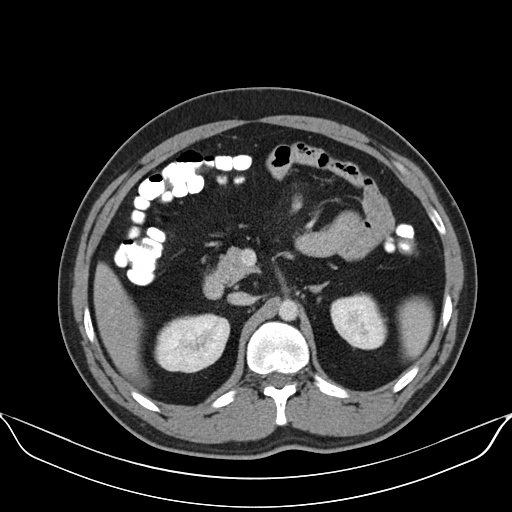
[im 76/94  soft-tissue]
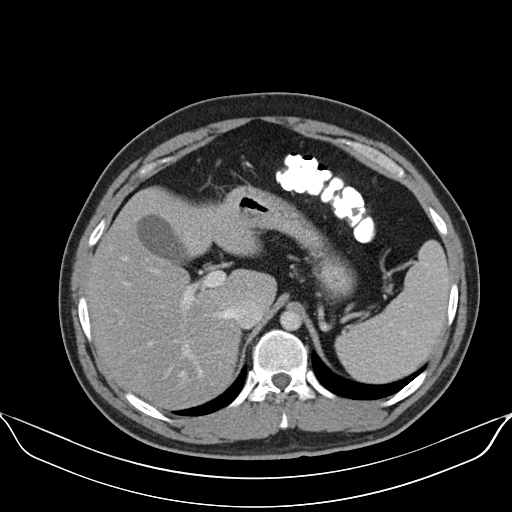
[im 82/94  soft-tissue]
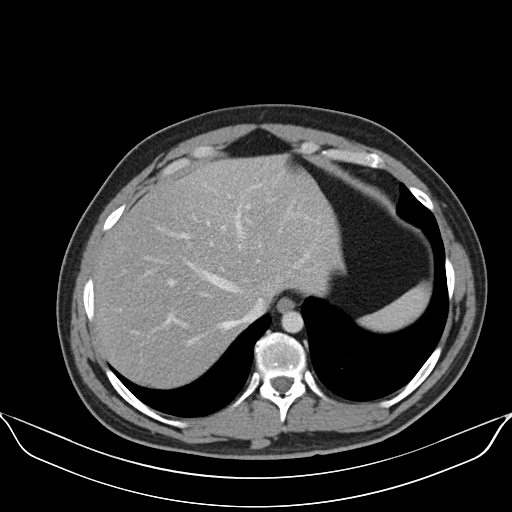
[im 82/94  lung]
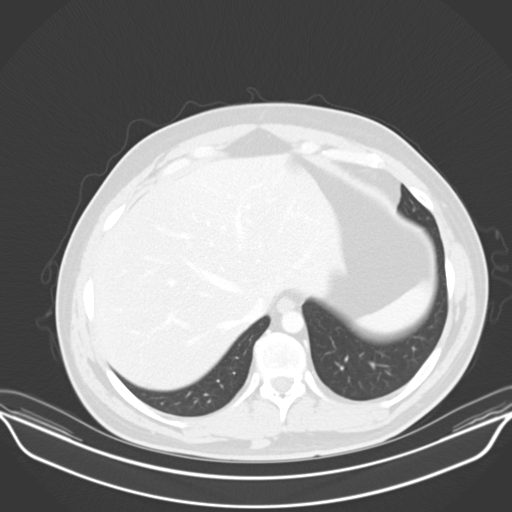
[im 85/94  lung]
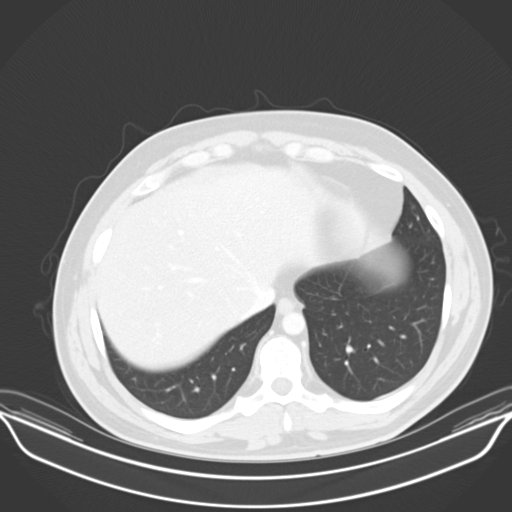
[im 88/94  soft-tissue]
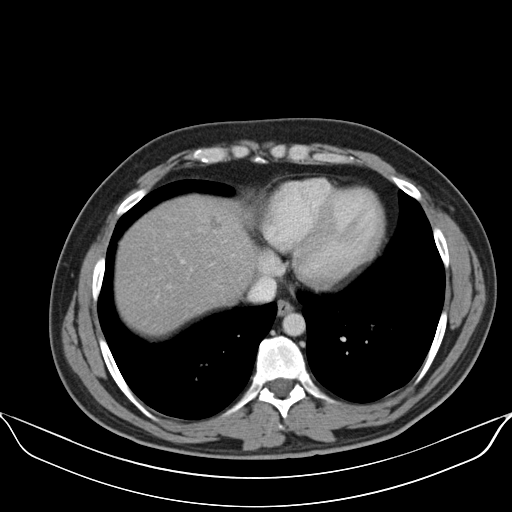
[im 88/94  lung]
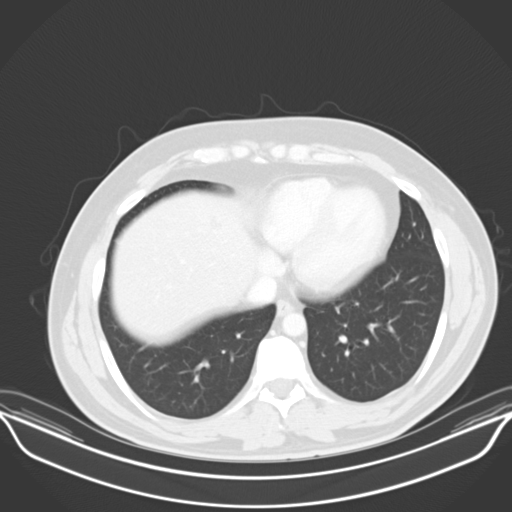
[im 91/94  lung]
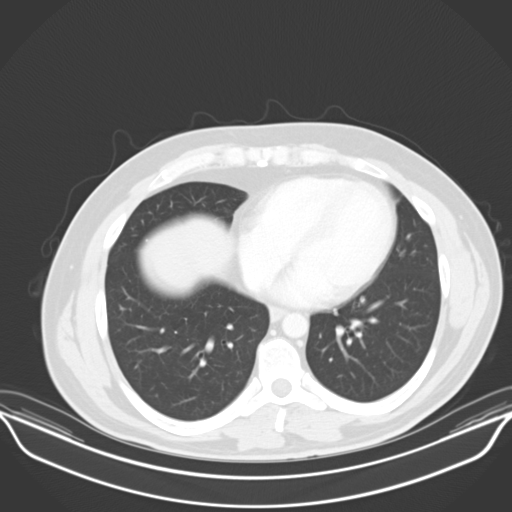

[15 of 32 positions shown; findings below may reference images not displayed]

EXAM
CT abdomen and pelvis.

INDICATION
abd pain, diverticulitis
ABDOMINAL PAIN LEFT SIDE. HX OF DIVERTICULITIS. LM

TECHNIQUE
Following the uneventful administration of 100 cc Omnipaque 300 low osmolar intravenous contrast,
routine CT of the abdomen and pelvis was performed.

COMPARISONS
No prior studies are available for comparison.

FINDINGS
Mild motion artifact in the lung bases. No consolidation, effusion, or pneumothorax. The
cardiomediastinal silhouette is not enlarged. No pericardial effusion. The liver is homogeneous in
density. Normal gallbladder. The spleen is within normal limits. The adrenal glands are also within
normal limits. No evidence of hydronephrosis, perinephric stranding, or renal mass. No small bowel
obstruction. Contrast opacification to the: No compelling evidence of colonic bowel wall thickening
or adjacent inflammatory stranding. There is mild prominence of the sigmoid colon bowel wall,
though the wall thickness measures only 2 millimeters. Slight ground-glass attenuation around the
root of the mesentery with a few small lymph nodes, nonspecific. No pathologic retroperitoneal
lymph nodes. A few lymph nodes are noted, though fatty hila are preserved. No ascites or
pneumoperitoneum. Osseous structures are unremarkable. Normal appendix is identified.

IMPRESSION
No acute obstructive or inflammatory pathology in the abdomen or pelvis.

## 2015-11-27 IMAGING — CR CHEST
2 series · 2 of 2 positions shown · non-contrast
Comparison: none

[chest pa x-wise]
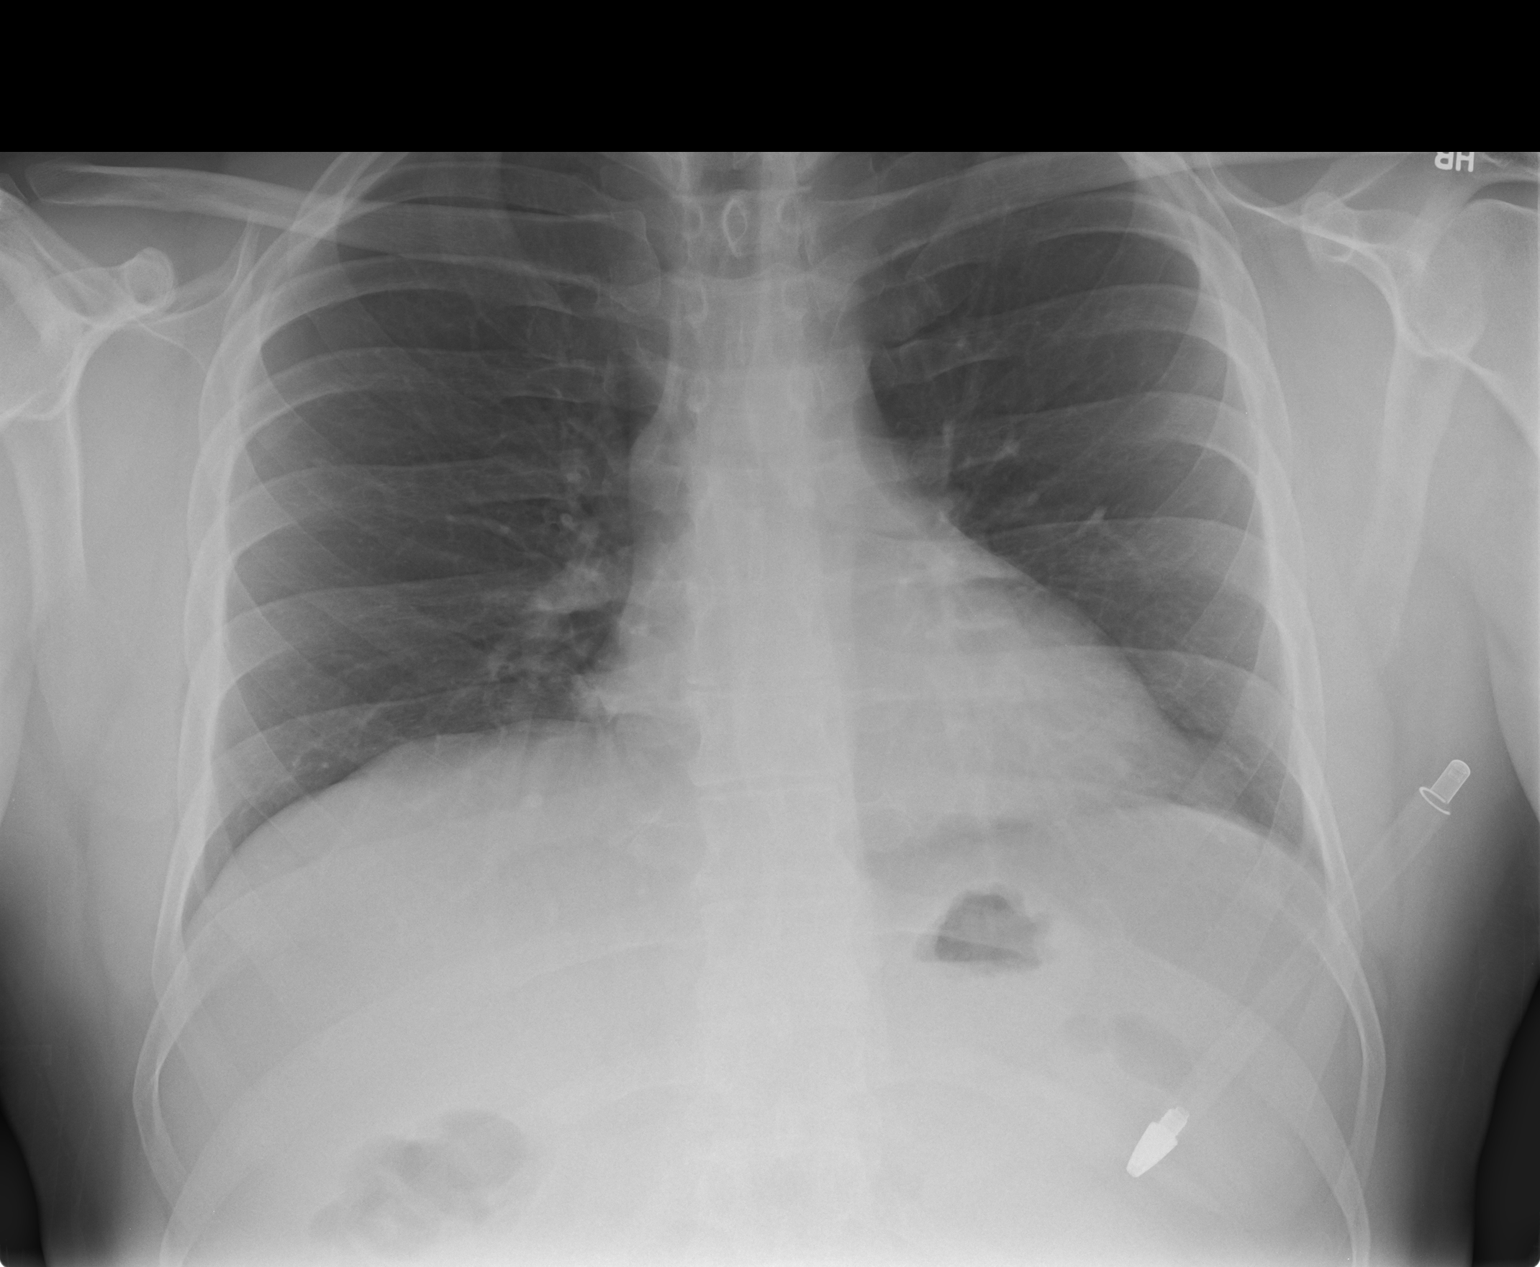

[chest lat]
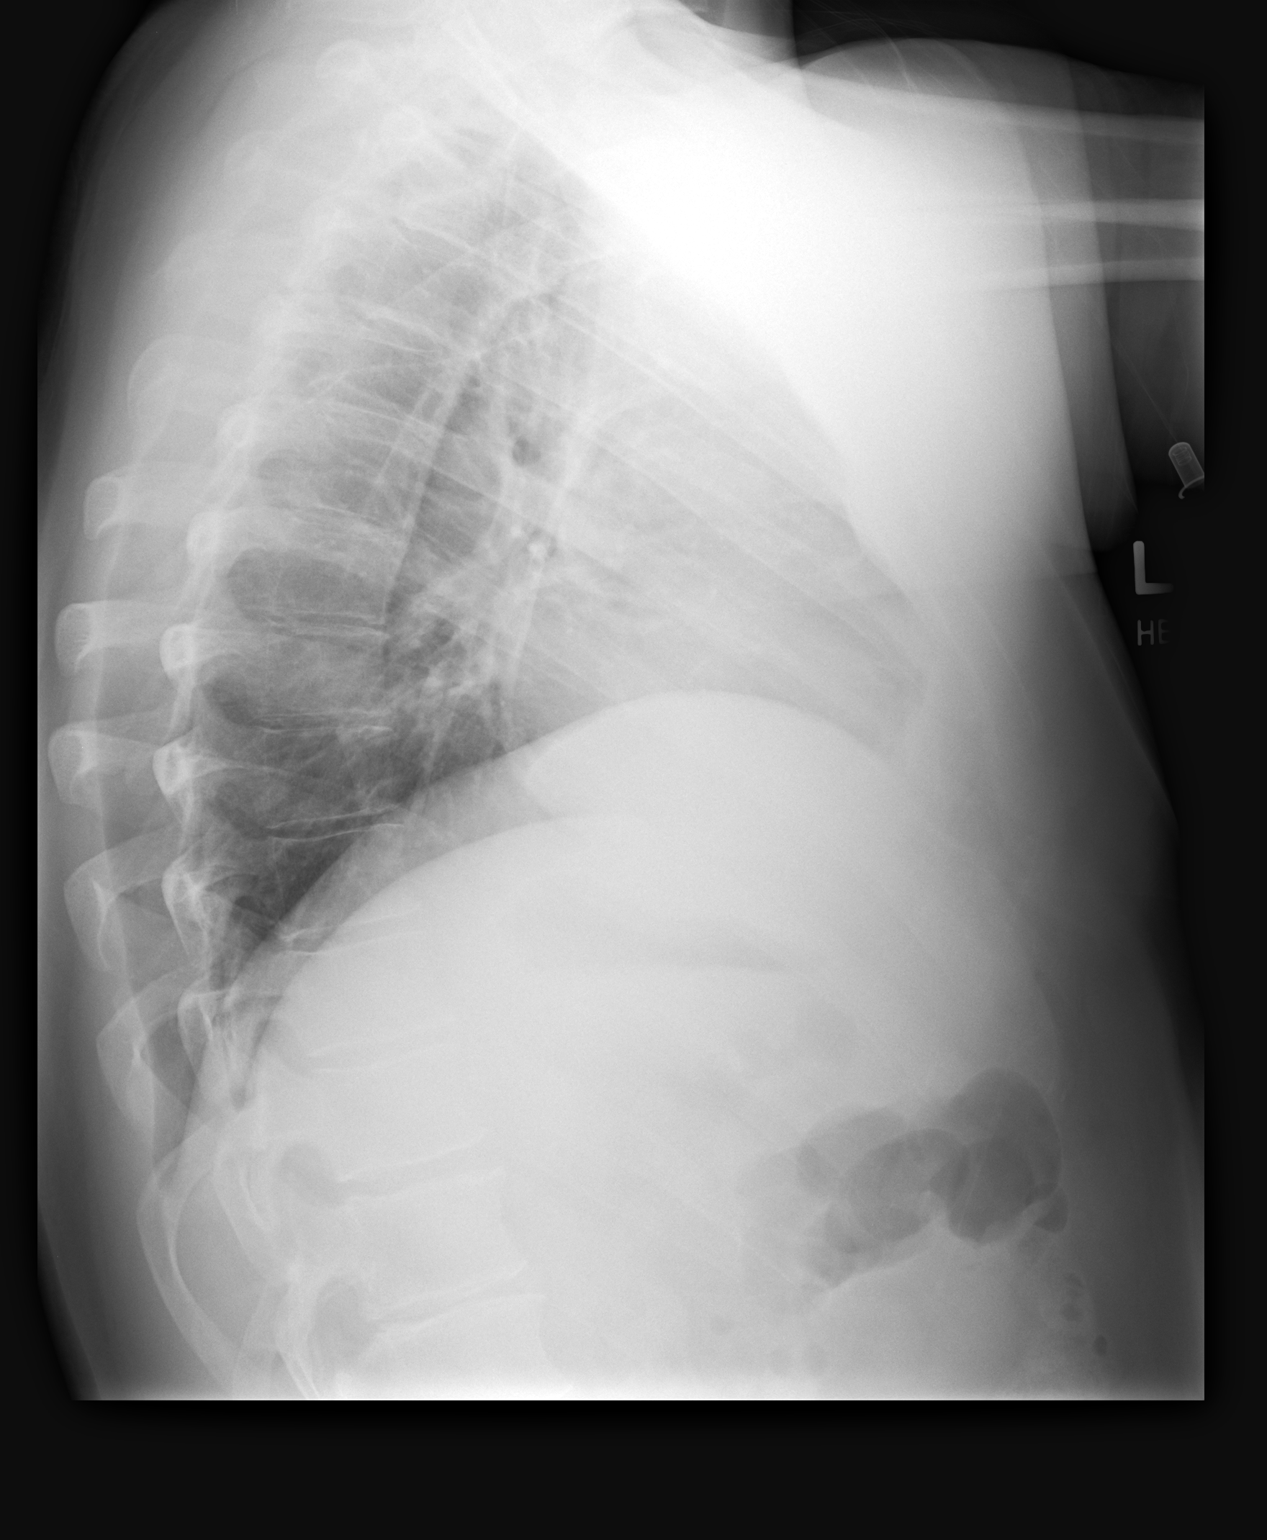

[2 of 2 positions shown; findings below may reference images not displayed]

DIAGNOSTIC STUDIES

EXAM
Two-view chest

INDICATION
+ TB SKIN TEST
INCONCLUSIVE TB SKIN TEST FOLLOWED BY POSITIVE SKIN TEST. NO CURRENT CHEST
SYMPTOMS OR COMPLAINTS. HB

TECHNIQUE
Frontal and lateral views

COMPARISONS
None available

FINDINGS
The heart and pulmonary vasculature are within normal limits. The lungs are clear without pleural
effusion, pneumothorax, or focal pulmonary consolidation. There is no radiographic evidence of
active tuberculosis.

IMPRESSION
No acute cardiopulmonary abnormality or radiographic evidence of active tuberculosis.

## 2016-05-06 IMAGING — CT Head^_WITHOUT_CONTRAST (Adult)
1 series · 16 of 30 positions shown, 20 images · non-contrast
Comparison: none

PROCEDURE: HEAD _WITHOUT_CONTRAST (ADULT)
HISTORY: SEIZURE. PT STATES HE BELIEVES HE HAD BRIEF LOC. C/O CHANGES IN VISION
IMMEDIATELY PRIOR TO SEIZURE, HAS SINCE RESOLVED. C/O CONTINUED NAUSEA.
TECHNIQUE: Axial CT imaging of the brain was performed without contrast. No comparison study
available.

[Series 2: brain w/o 4.8 brain · axial · non-contrast · 0.44mm/px · z∈[+135,+281]mm · 16 of 31 slices shown, 20 images]
[im 2/31  brain]
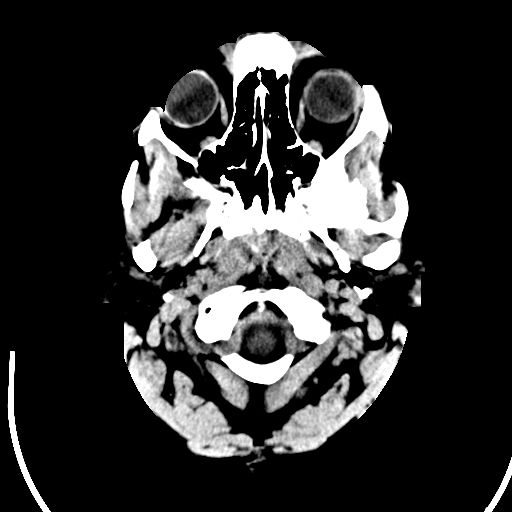
[im 2/31  bone]
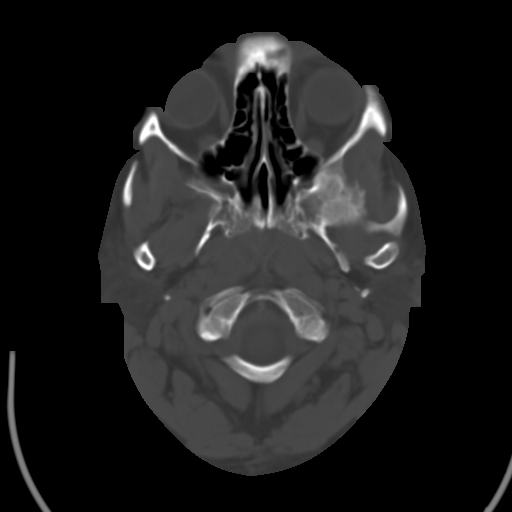
[im 4/31  brain]
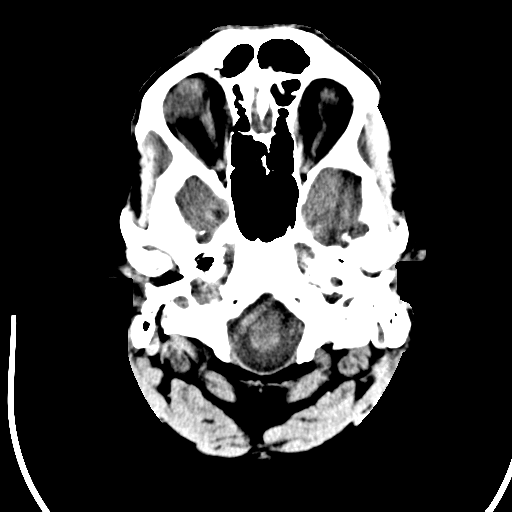
[im 6/31  brain]
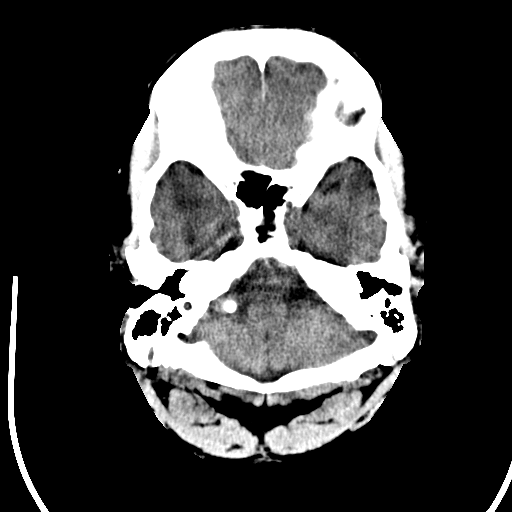
[im 8/31  brain]
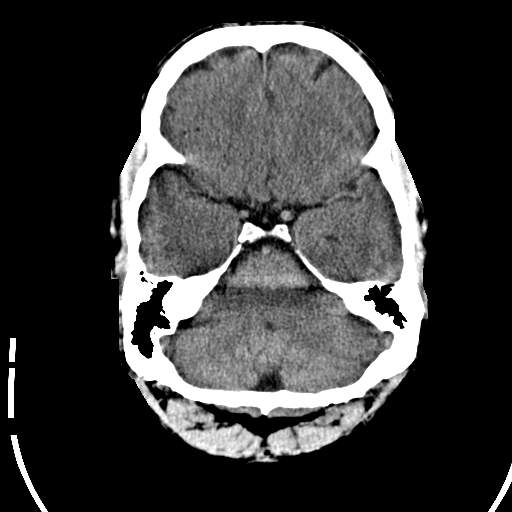
[im 9/31  brain]
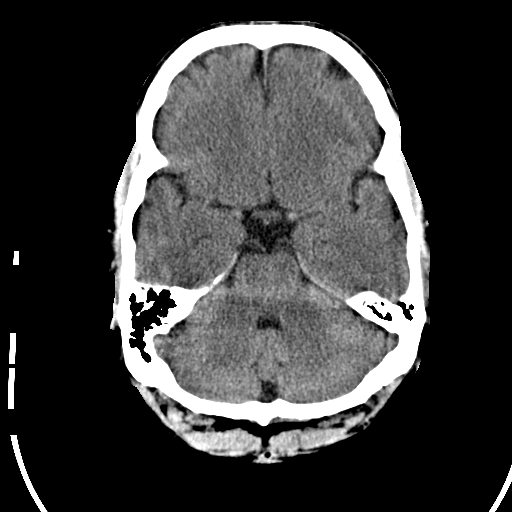
[im 9/31  bone]
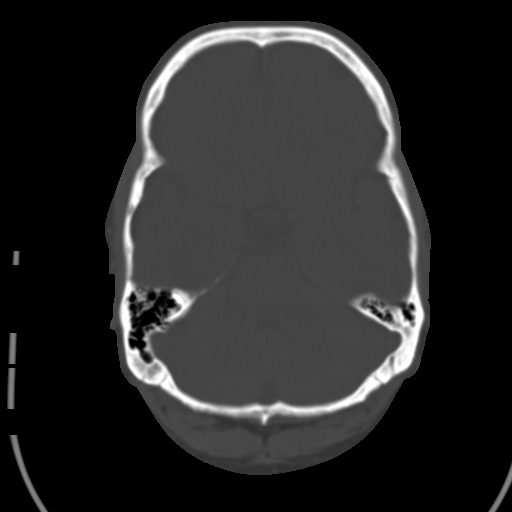
[im 11/31  brain]
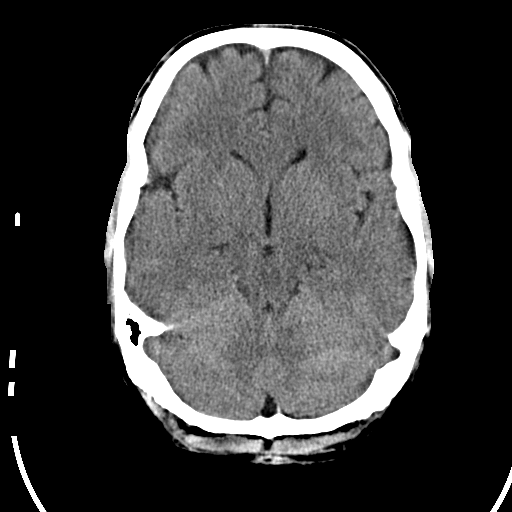
[im 13/31  brain]
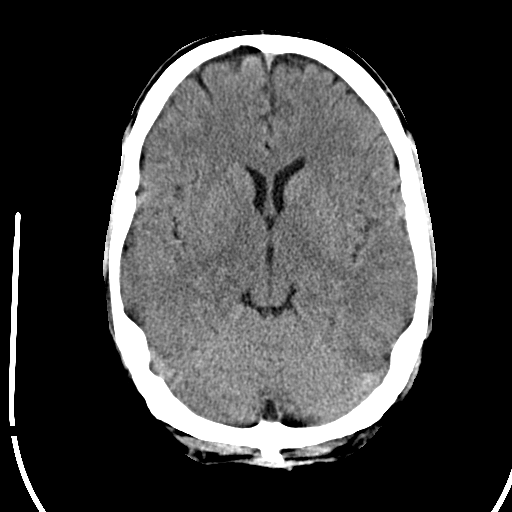
[im 15/31  brain]
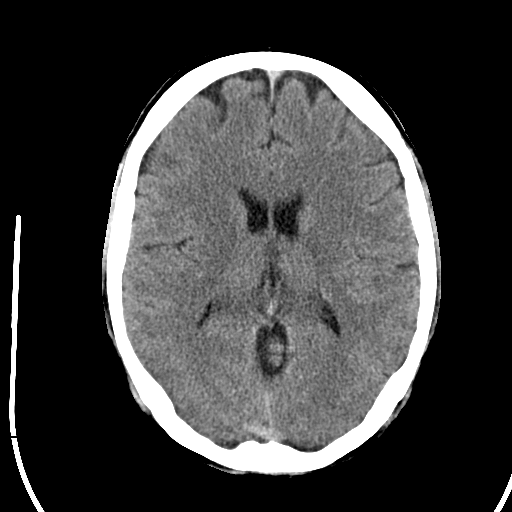
[im 16/31  brain]
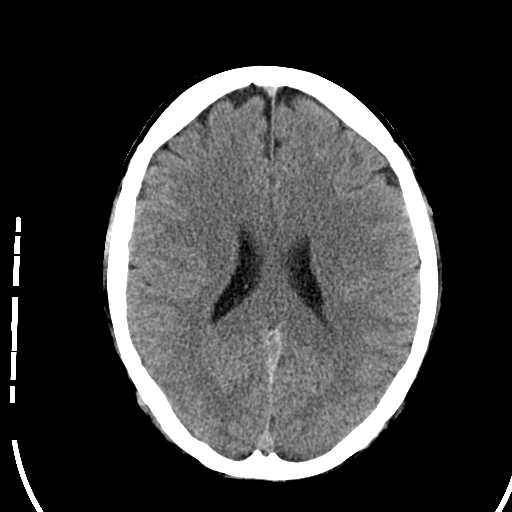
[im 16/31  bone]
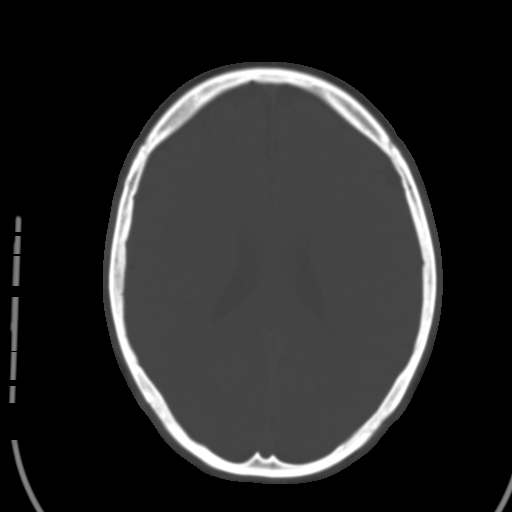
[im 18/31  brain]
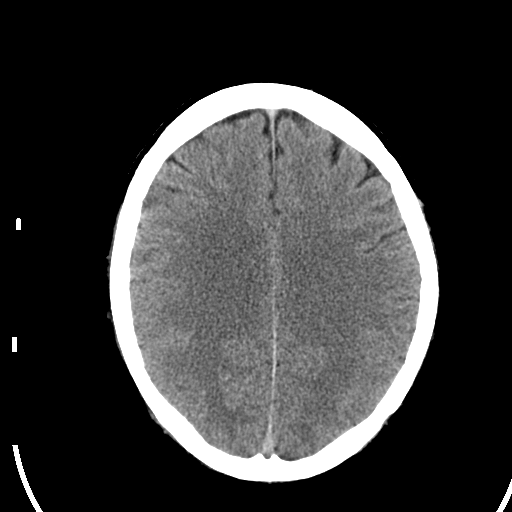
[im 20/31  brain]
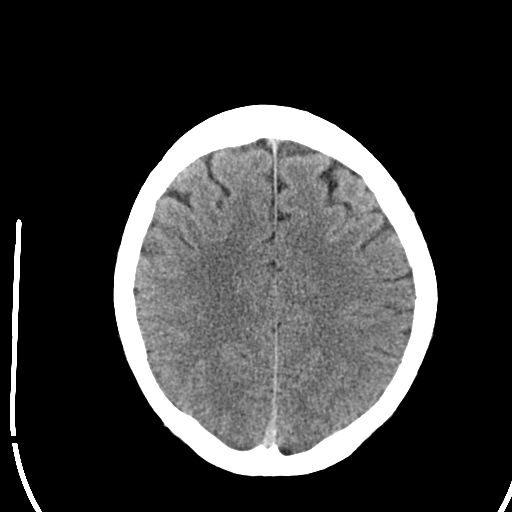
[im 22/31  brain]
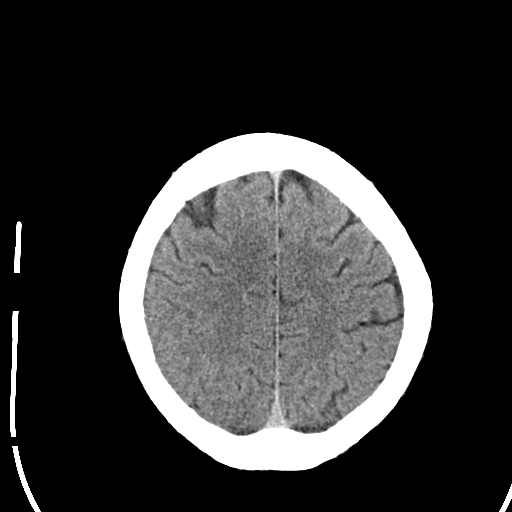
[im 23/31  brain]
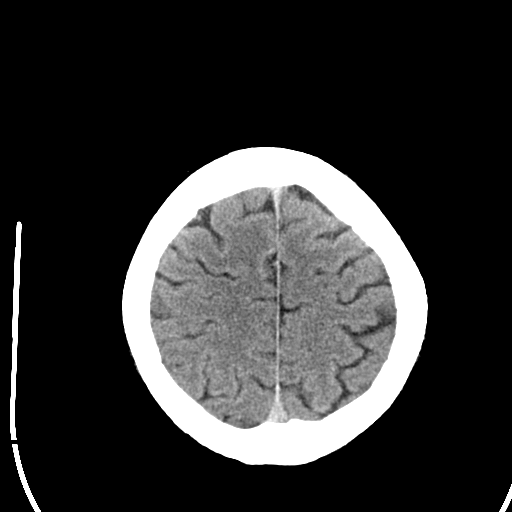
[im 23/31  bone]
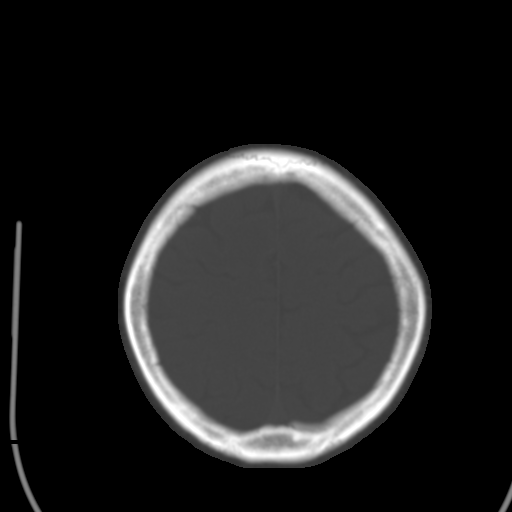
[im 25/31  brain]
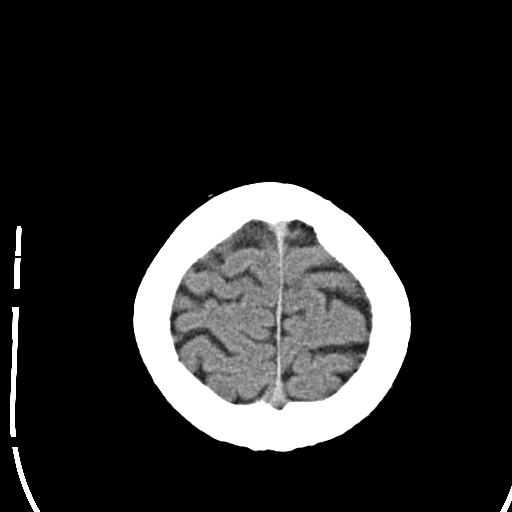
[im 27/31  brain]
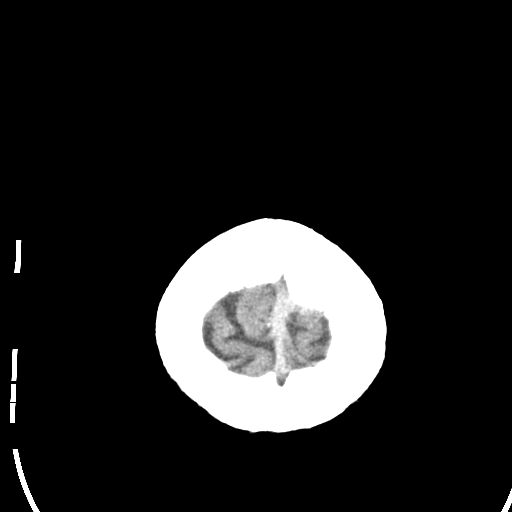
[im 29/31  brain]
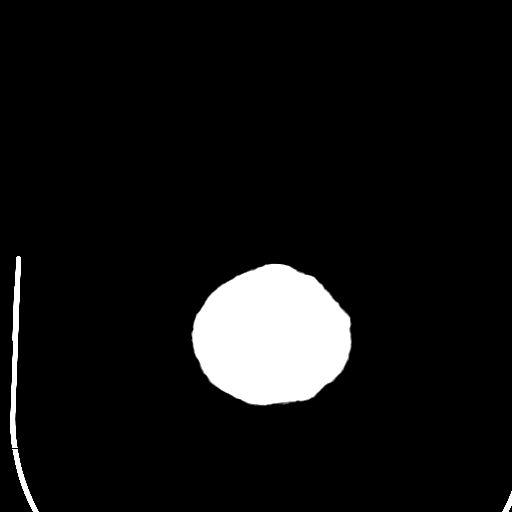

[16 of 30 positions shown; findings below may reference images not displayed]

FINDINGS: There is good gray white differentiation and sulcal detail. I don't see any acute large
vessel distribution infarction, intracranial bleed, or focal mass. No significant periventricular or
subcortical hypodensities are seen. The ventricles are normal in size, shape, and midline in
position. I
don't see any cerebellopontine angle mass. CT visualization of the brainstem, basal ganglia, and
cerebellum are within normal limits. No displaced skull fracture seen. The paranasal sinuses are
clear.
IMPRESSION: No acute large vessel distribution infarction, intracranial bleed, or focal mass seen.

## 2017-01-02 IMAGING — US USSCROT
1 series · 14 of 25 positions shown · non-contrast
Comparison: none

[Series 1: us scrotum content · 14 of 59 slices shown]
[im 1/59]
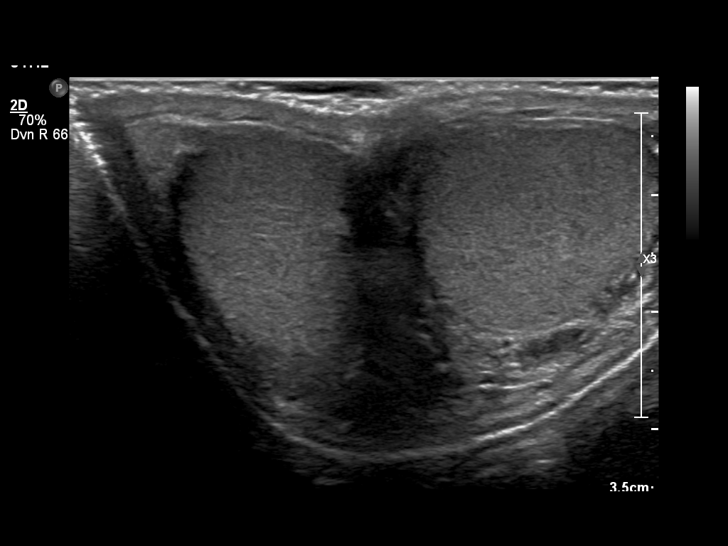
[im 5/59]
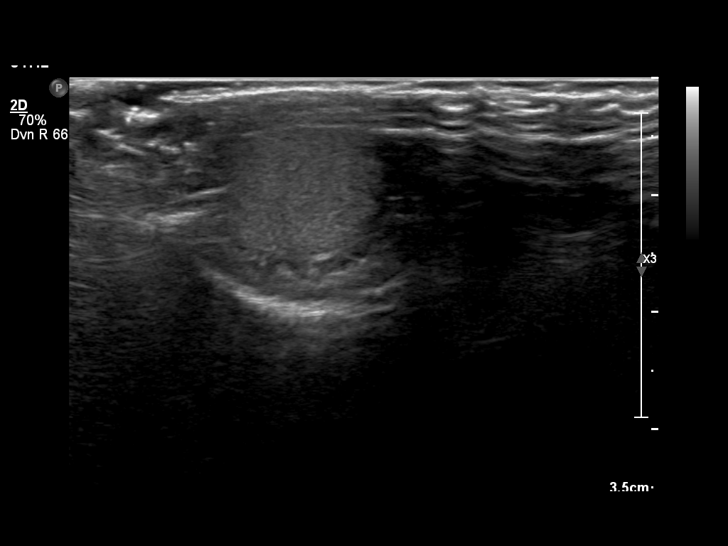
[im 10/59]
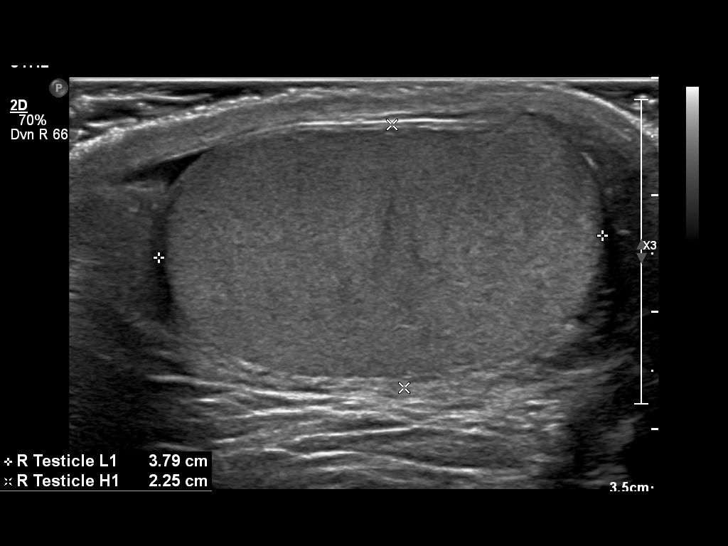
[im 15/59]
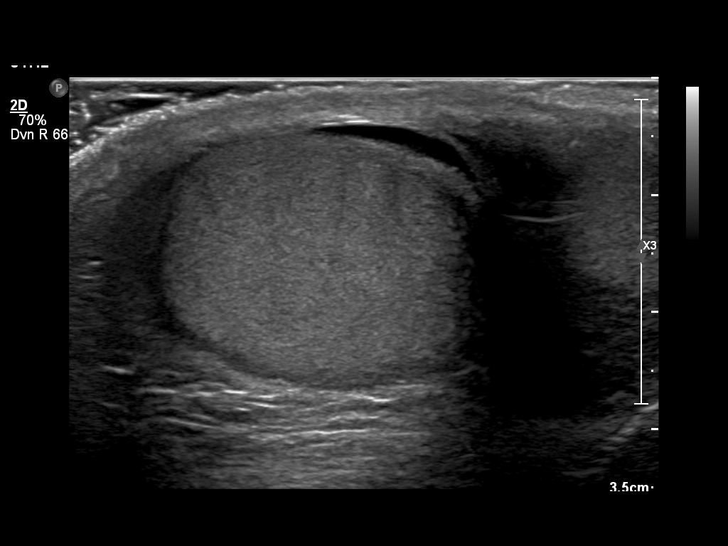
[im 20/59]
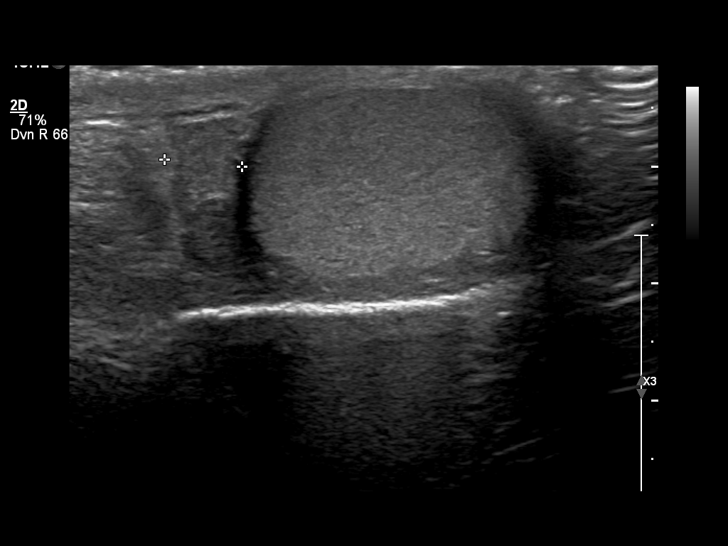
[im 22/59]
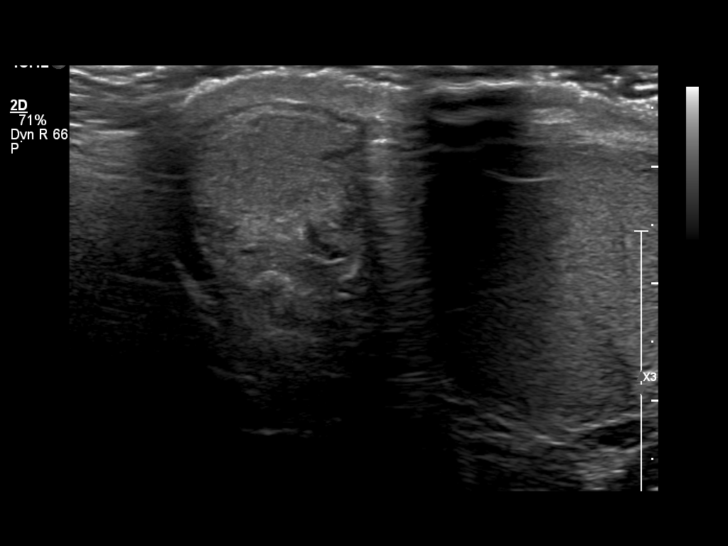
[im 27/59]
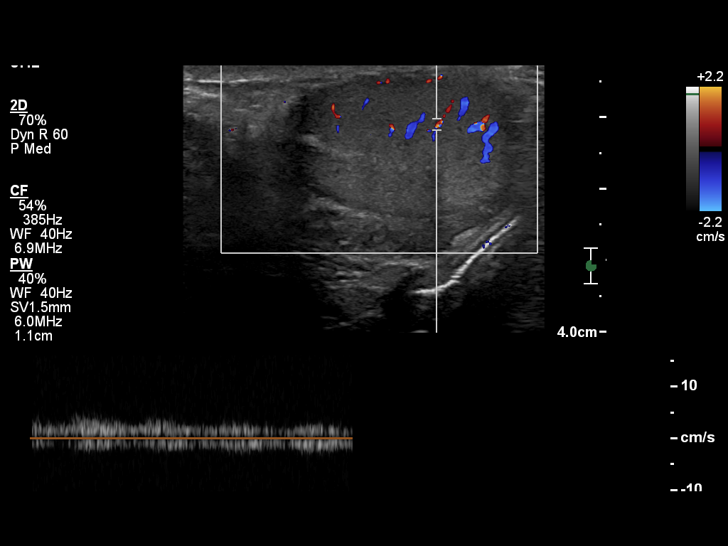
[im 32/59]
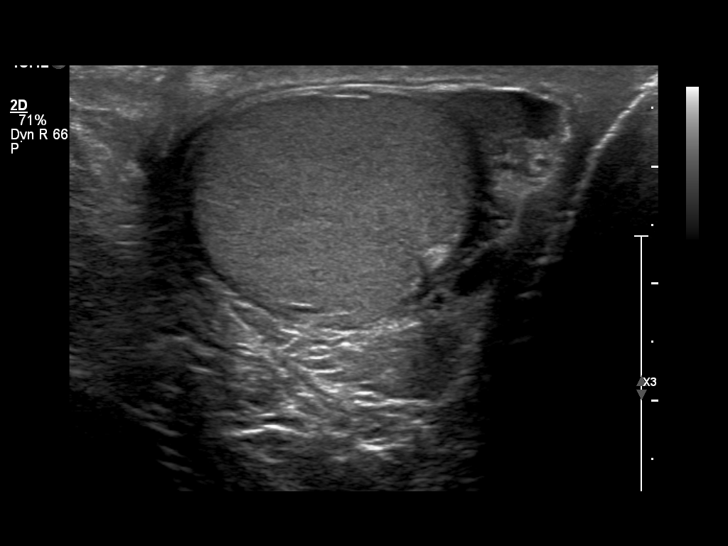
[im 37/59]
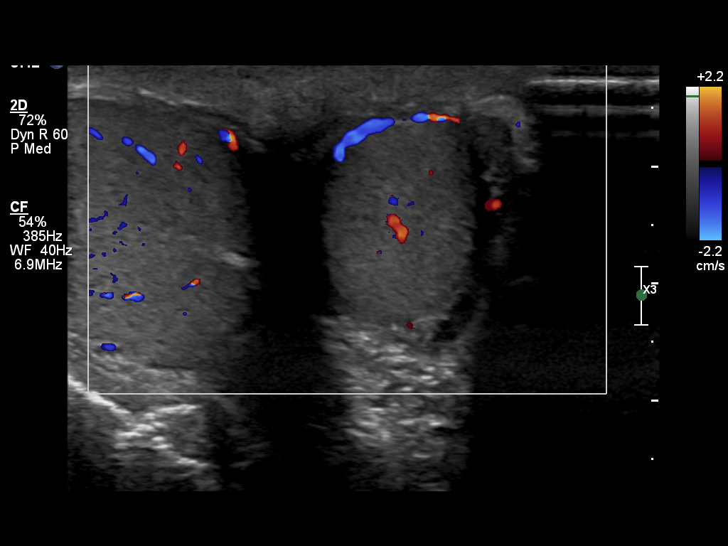
[im 39/59]
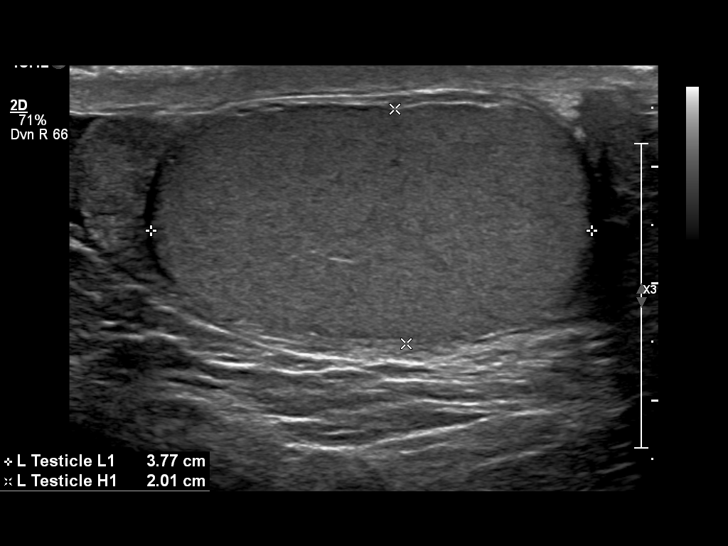
[im 44/59]
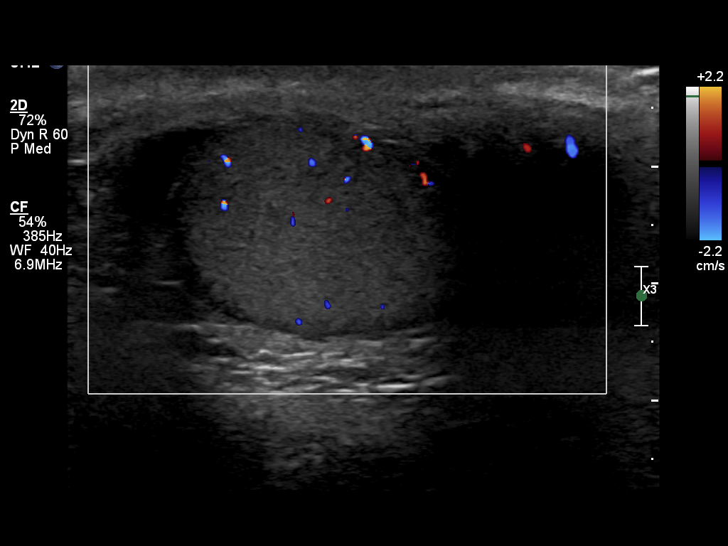
[im 49/59]
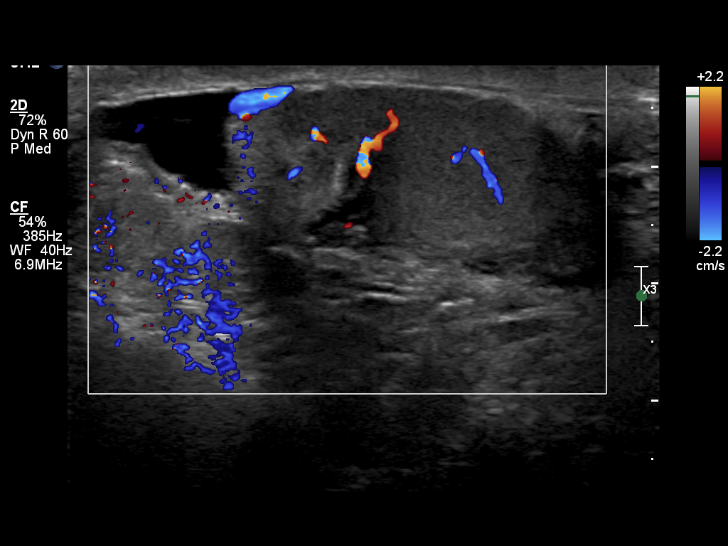
[im 54/59]
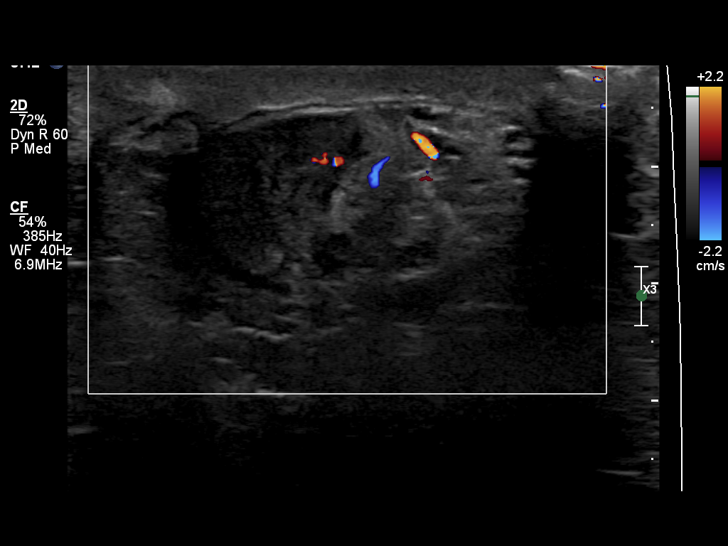
[im 59/59]
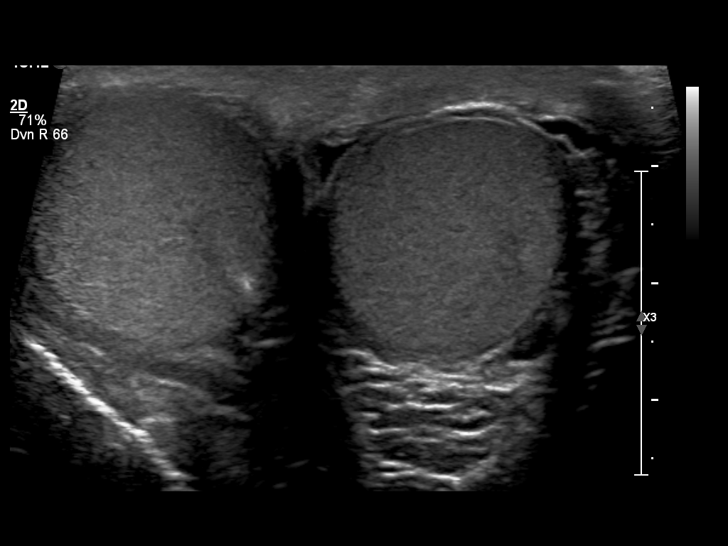

[14 of 25 positions shown; findings below may reference images not displayed]

ULTRASOUND REPORT

DIAGNOSTIC STUDIES

EXAM

Scrotal ultrasound.

INDICATION

R testicular pain- 3 days...progressing

TECHNIQUE

High-resolution grayscale, color Doppler, duplex interrogation of the testicles.

COMPARISONS

None.

FINDINGS

The right testicle measures 3.8 x 2.3 x 2.6 centimeters and the left testicle measures 3.8 x 2.0 x
2.4 centimeters. Homogeneous echogenicity with normal blood flow in both testicles. The epididymis
is unremarkable. There is a small amount of left scrotal fluid.

IMPRESSION

Normal testicles.

## 2017-03-20 IMAGING — CT Head^_WITHOUT_CONTRAST (Adult)
1 series · 16 of 30 positions shown, 20 images · non-contrast
Comparison: none

[Series 2: brain w/o 4.8 brain · axial · non-contrast · 0.44mm/px · z∈[+107,+246]mm · 16 of 32 slices shown, 20 images]
[im 2/32  brain]
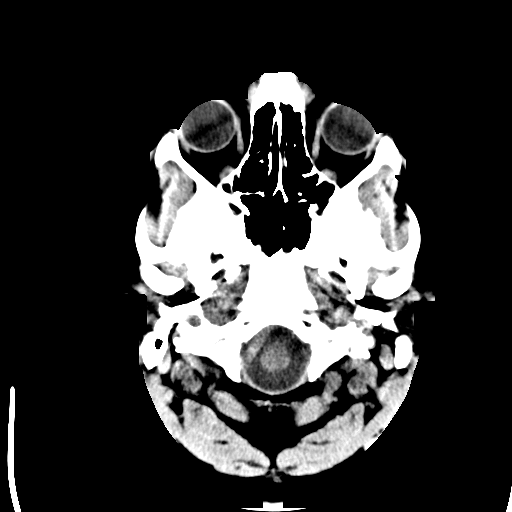
[im 2/32  bone]
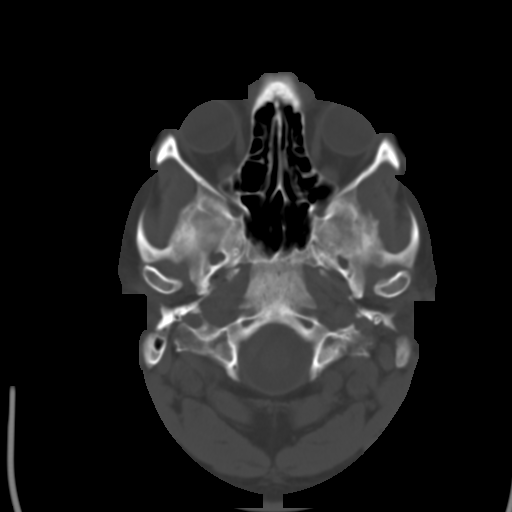
[im 4/32  brain]
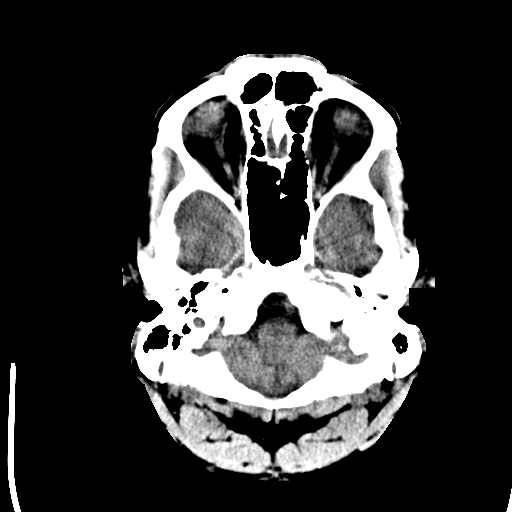
[im 6/32  brain]
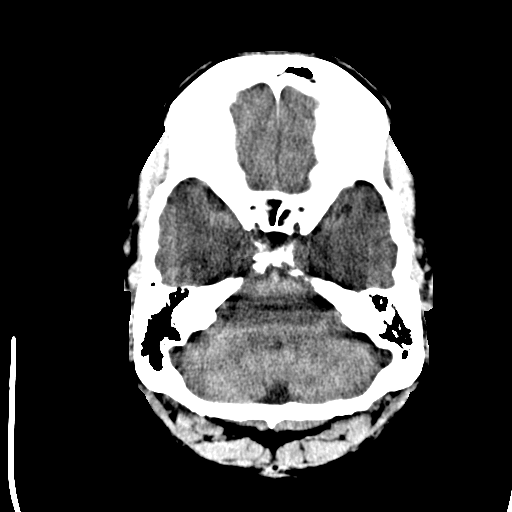
[im 8/32  brain]
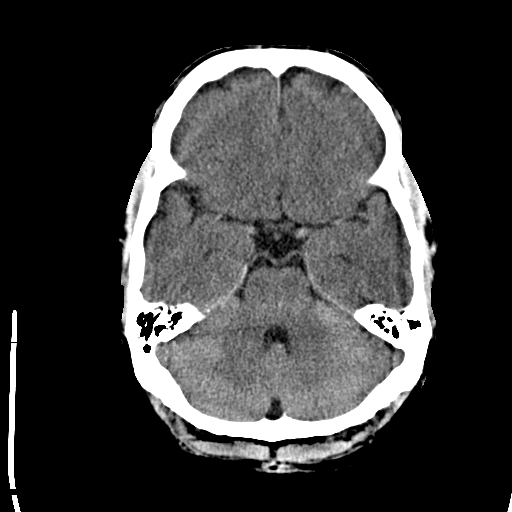
[im 9/32  brain]
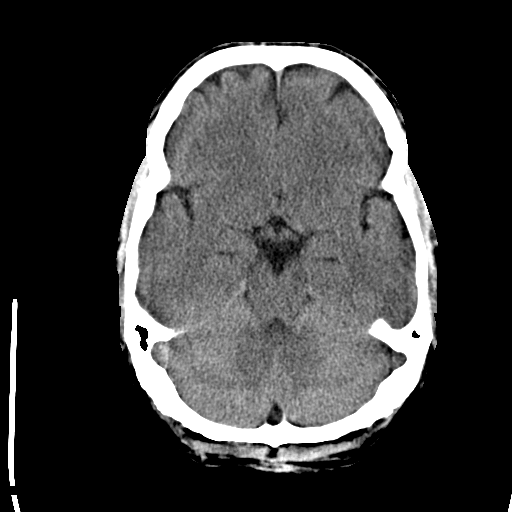
[im 9/32  bone]
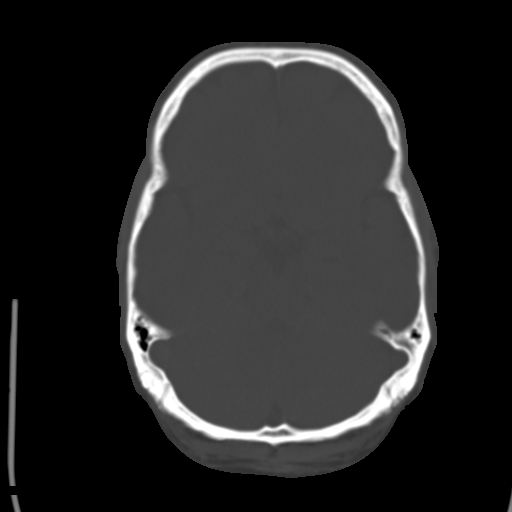
[im 11/32  brain]
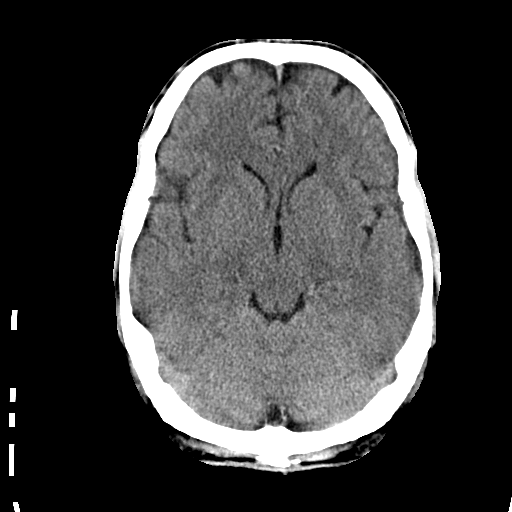
[im 13/32  brain]
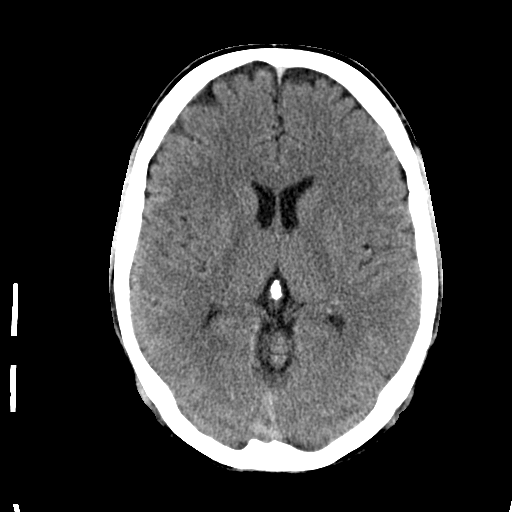
[im 15/32  brain]
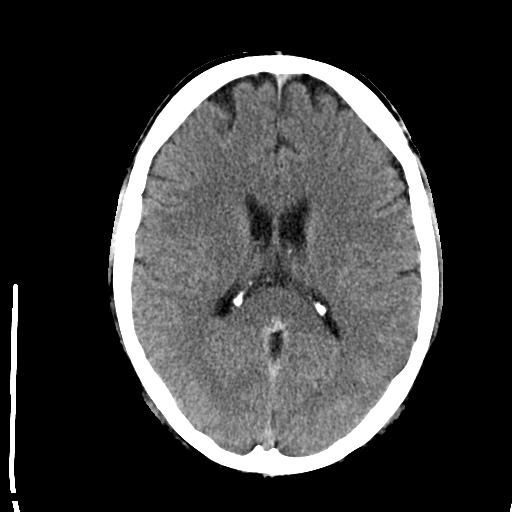
[im 17/32  brain]
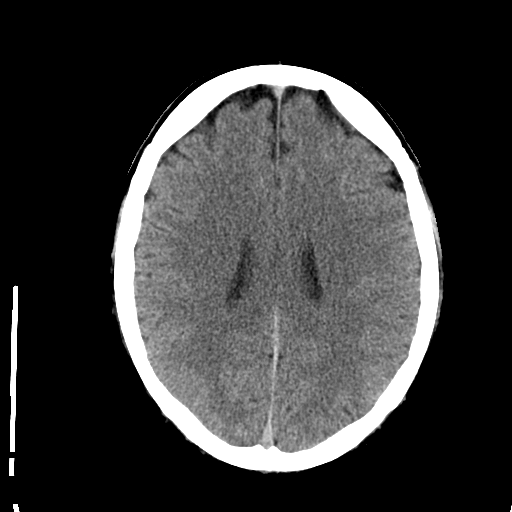
[im 17/32  bone]
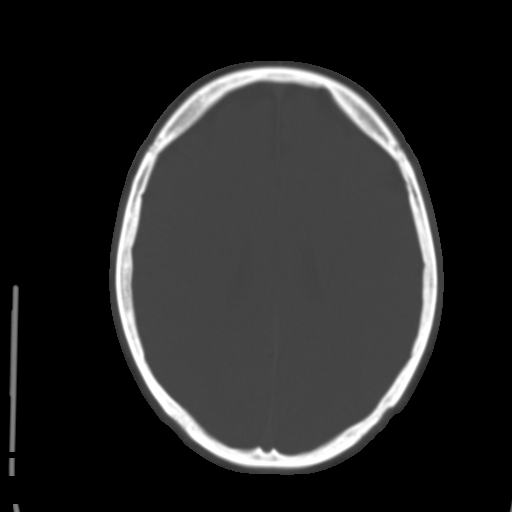
[im 19/32  brain]
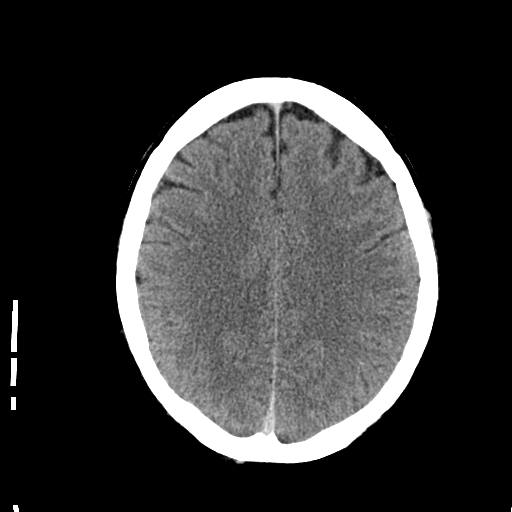
[im 21/32  brain]
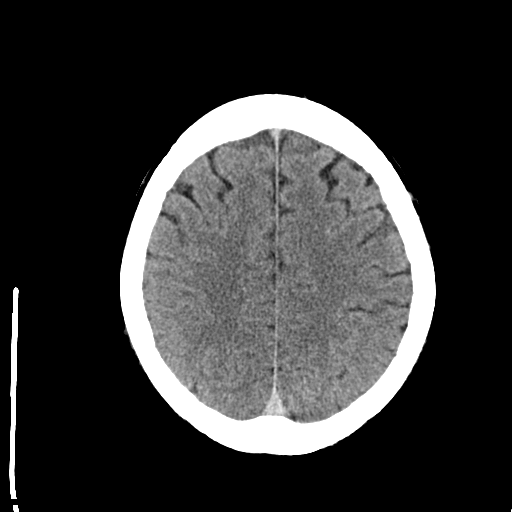
[im 23/32  brain]
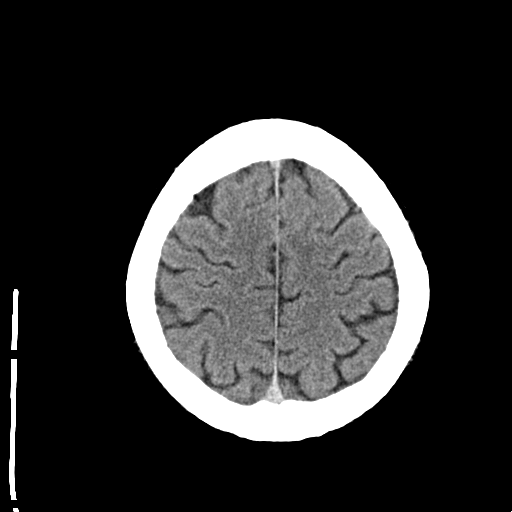
[im 24/32  brain]
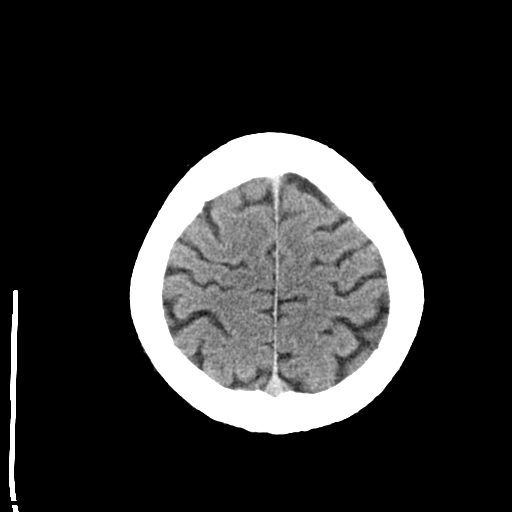
[im 24/32  bone]
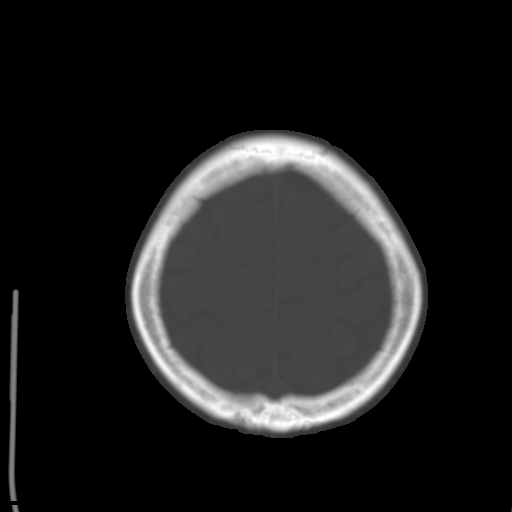
[im 26/32  brain]
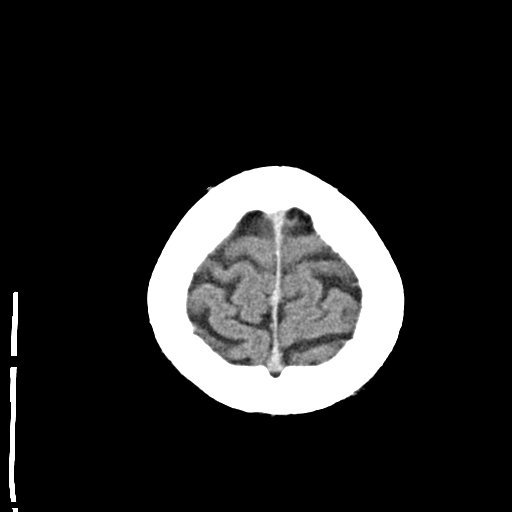
[im 28/32  brain]
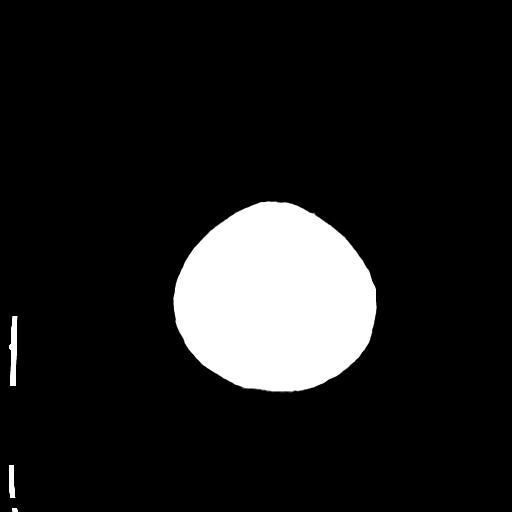
[im 30/32  brain]
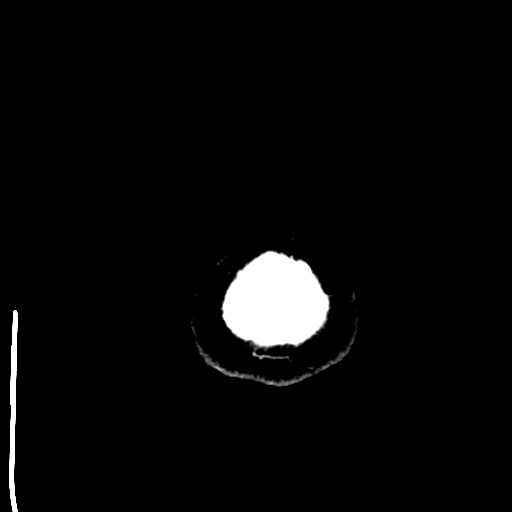

[16 of 30 positions shown; findings below may reference images not displayed]

DIAGNOSTIC STUDIES

EXAM

COMPUTED TOMOGRAPHY, HEAD OR BRAIN; WITHOUT CONTRAST MATERIAL, CPT 32062

INDICATION

HA high BP
PT STATES HEADACHE, INCREASED BP. HX OF HTN. TJ

TECHNIQUE

Multiple contiguous transaxial images were obtained through the brain utilizing a multidetector CT
scanner.

All CT scans at this facility use dose modulation, iterative reconstruction, and/or weight based
dosing when appropriate to reduce radiation dose to as low as reasonably achievable.

COMPARISONS

Previous examination dated 05/06/2016.

FINDINGS

The ventricles and sulci appear are normal in caliber. There is no mass effect or shift in the
midline structures. There are no intra or extra axial collections of blood or fluid present. The
skull base and overlying calvarium are within normal limits.

IMPRESSION

Normal CT brain. No interval changes.

## 2018-11-14 ENCOUNTER — Encounter: Admit: 2018-11-14 | Discharge: 2018-11-15

## 2018-11-14 IMAGING — US ABDCM
1 series · 13 of 25 positions shown · non-contrast
Comparison: none

[Series 1: us abdomen complete · 13 of 98 slices shown]
[im 1/98]
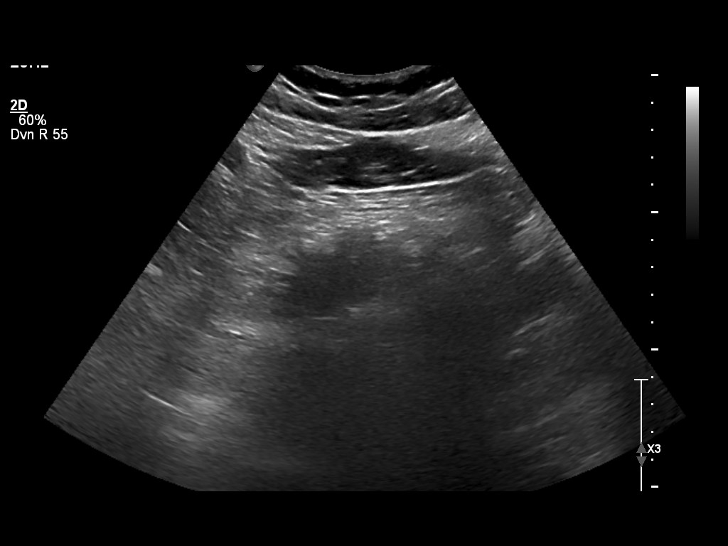
[im 9/98]
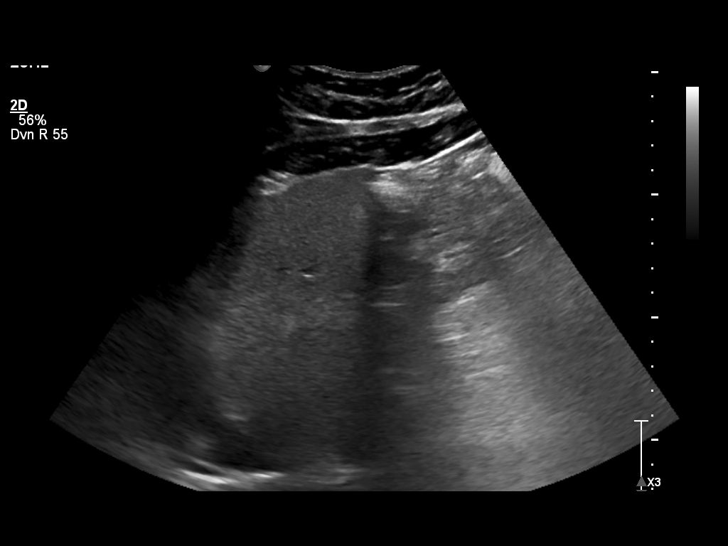
[im 17/98]
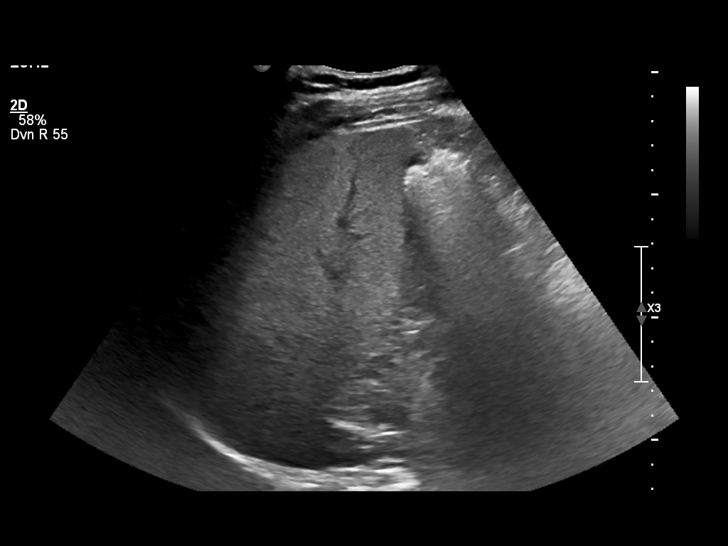
[im 25/98]
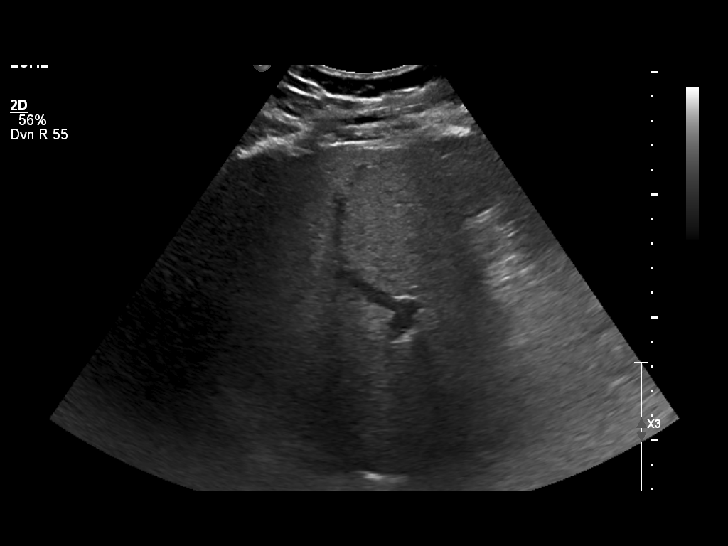
[im 33/98]
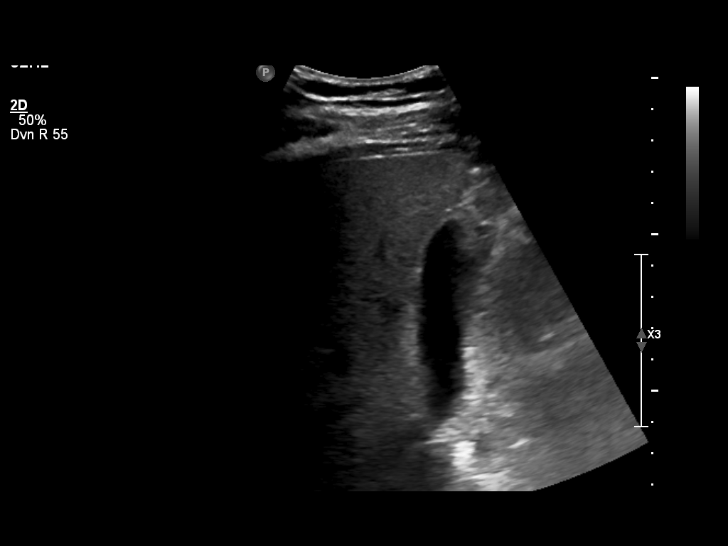
[im 41/98]
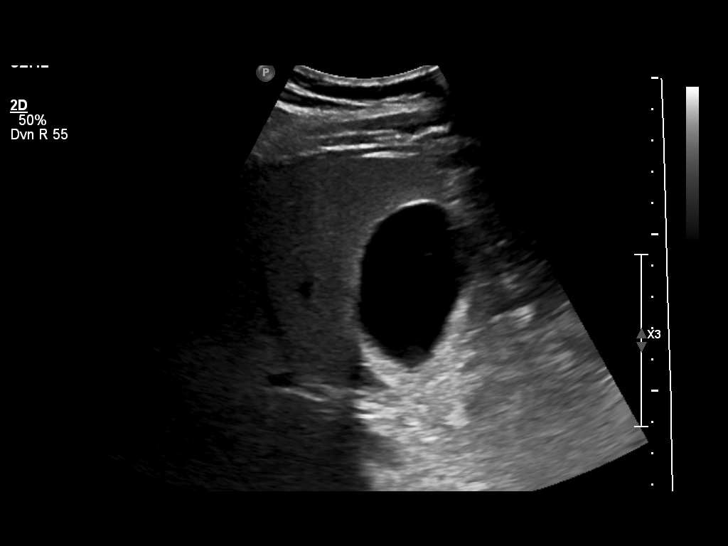
[im 49/98]
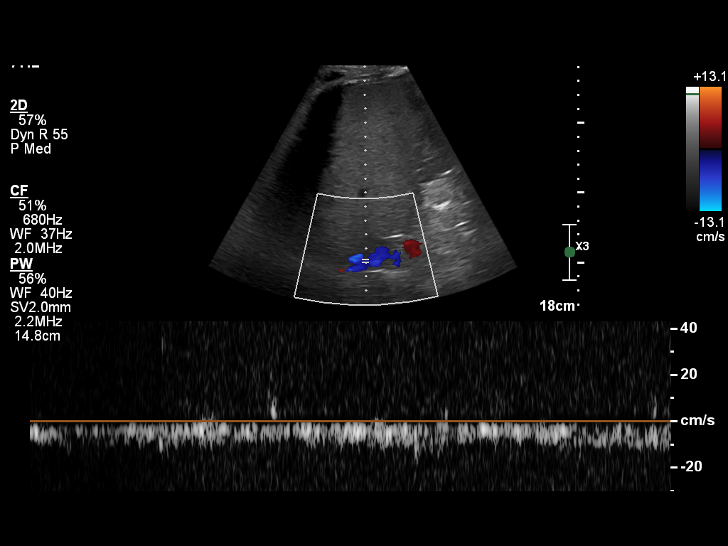
[im 57/98]
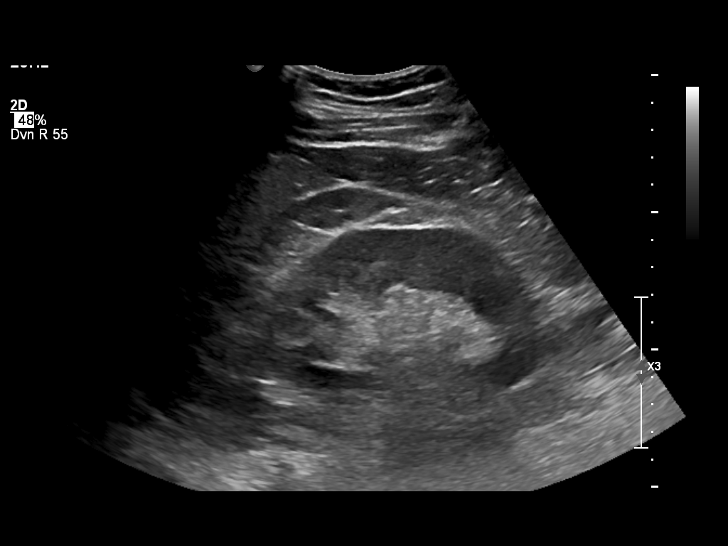
[im 65/98]
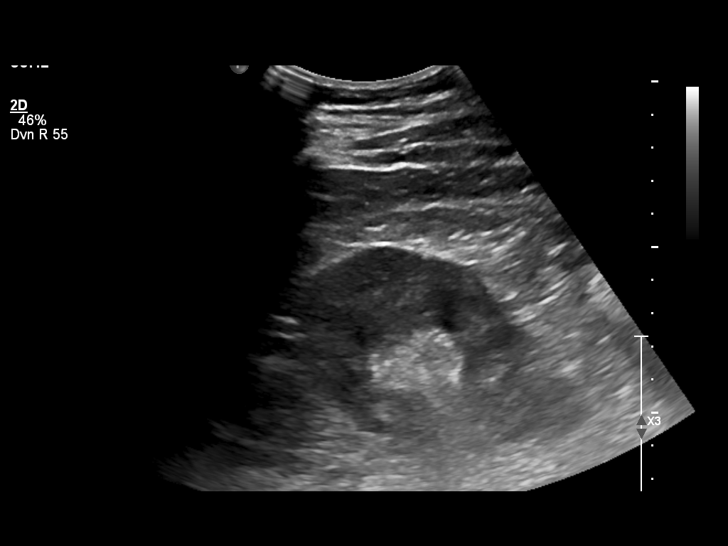
[im 73/98]
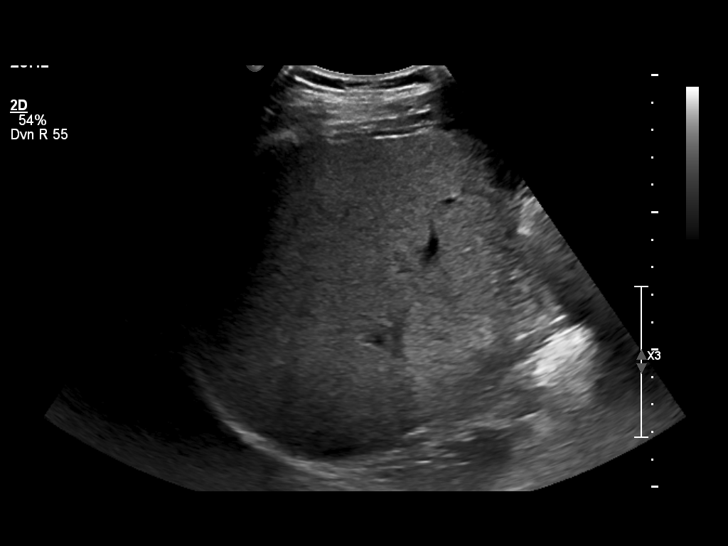
[im 81/98]
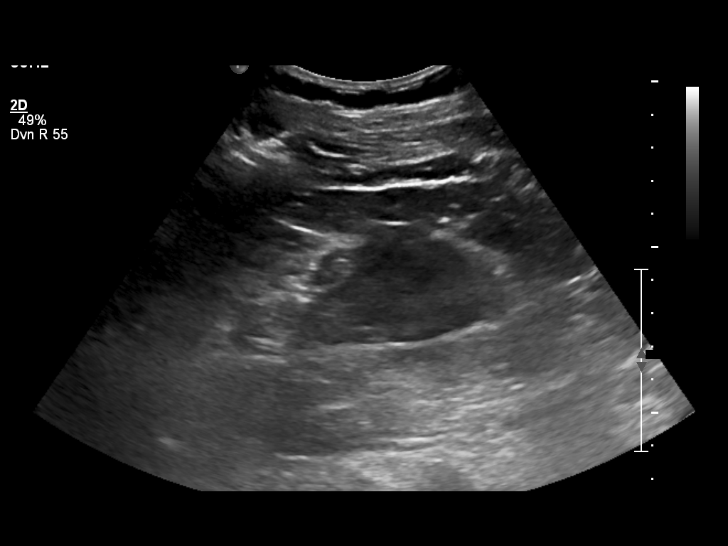
[im 89/98]
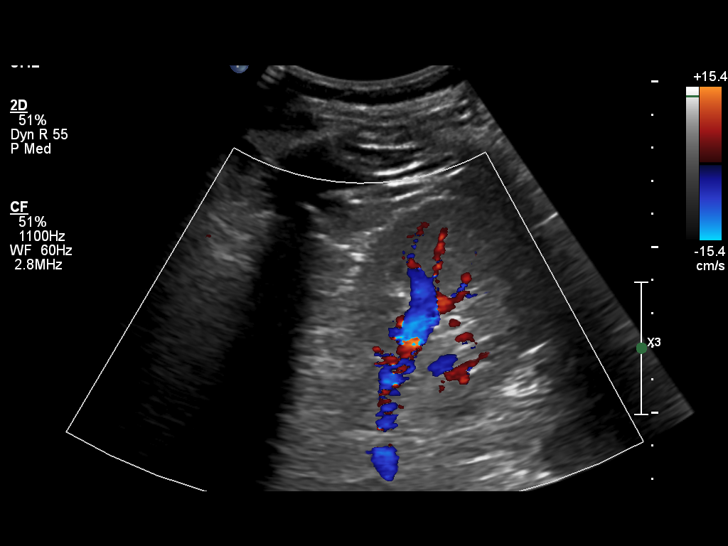
[im 98/98]
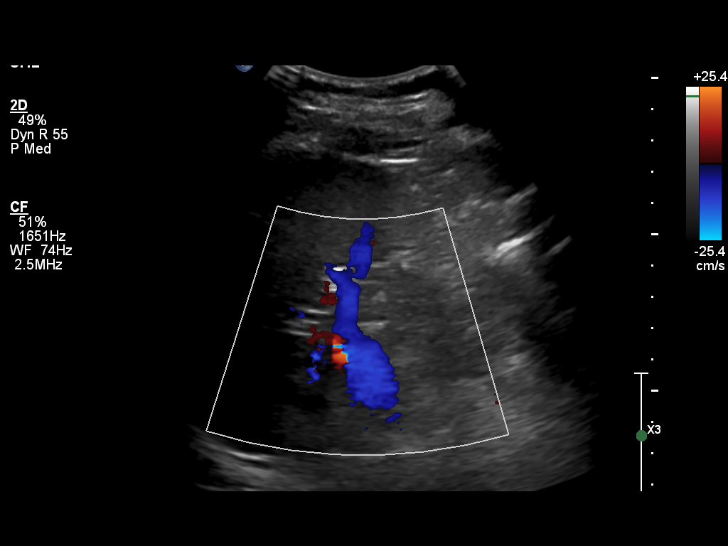

[13 of 25 positions shown; findings below may reference images not displayed]

EXAM
ULTRASOUND, ABDOMINAL, REAL TIME WITH IMAGE DOCUMENTATION, COMPLETE; CPT 16100

INDICATION
ELEVATED LIVER FUNCTION TESTS.

TECHNIQUE
Multiple static grayscale images of the abdomen were generated utilizing a curved 5MHz probe.

COMPARISONS
[Reference is made to CT scan of the abdomen and pelvis dated 05/08/2015 and abdominal ultrasound
dated 04/22/2015.

FINDINGS
The liver appears normal in size. Diffusely increased echotexture is consistent with hepatic
steatosis. The pancreas is obscured by overlying bowel gas. The spleen appears within normal limits.
It measures 10.6 cm in length.
There is no evidence of gallstones, pericholecystic fluid or focal gallbladder wall thickening. The
common bile duct measures [4.4 mm. There is no evidence of a sonographic Murphy's sign.
The right kidney measures 10.3 x 6.2 x 6.1 cm. The left kidney measures 10.4 x 6.8 x 5.1 cm. There
are no solid or cystic renal masses. There is no evidence of hydronephrosis or obstructive uropathy.
The renal echotexture is within normal limits. The aorta and inferior vena cava appear
unremarkable.

IMPRESSION
Hepatic steatosis. The pancreas is obscured by overlying bowel gas. There are no significant
interval changes.

Tech Notes:

ELEVATED LFTS

## 2018-11-19 ENCOUNTER — Encounter: Admit: 2018-11-19 | Discharge: 2018-11-19

## 2018-11-19 NOTE — Telephone Encounter
Received new referral via fax. Docs scanned in 08.03.  I have sent a request for the CBC lab results

## 2018-11-21 NOTE — Telephone Encounter
Hepatology Referral Summary    Name: Jesse Marquez               MRN: [id number]                     DOB: 1980/10/02    Insurance: [employer]    Provider Info --   - Referring: Lenna Sciara Huntington PA   - PCP:    Radiology/Facility: [facility] [place] 10/2018 [facility] report scanned in     Pathology/Facility: n/a    Endoscopy/Facility: n/a    Reason for Visit/Diagnosis: Fatty liver    HPI Summary: Pt has had some elevation of AST and ALT on June labs, an abd [place] confirmed hepatic steatosis, being referred for management.    Appointment Needs --   - Provider: Dr. Tyrone Nine   - Urgency: Next available   - Department: [facility]   - Other (Interpreter, Transportation, Financial, Other Consults)

## 2018-11-22 NOTE — Telephone Encounter
No CBC labs were sent.  Only the CMP

## 2018-11-22 NOTE — Telephone Encounter
Email sent to [facility] to request images.

## 2018-11-23 ENCOUNTER — Encounter: Admit: 2018-11-23 | Discharge: 2018-11-23

## 2018-11-23 NOTE — Telephone Encounter
1st attempt: lvm for pt to return call to sch initial consult with Dr. Floyd.

## 2018-11-23 NOTE — Telephone Encounter
Images uploaded and can now be viewed in chart

## 2018-11-26 NOTE — Telephone Encounter
2nd attempt: lvm for pt to return call to sch initial consult with Dr. Floyd.

## 2018-11-27 NOTE — Telephone Encounter
3rd attempt: lvm for pt to return call to sch initial consult with Dr. Floyd.

## 2020-08-20 IMAGING — CR PAPERORDER
1 series · 1 of 1 positions shown · non-contrast
Comparison: none

[ankle ap]
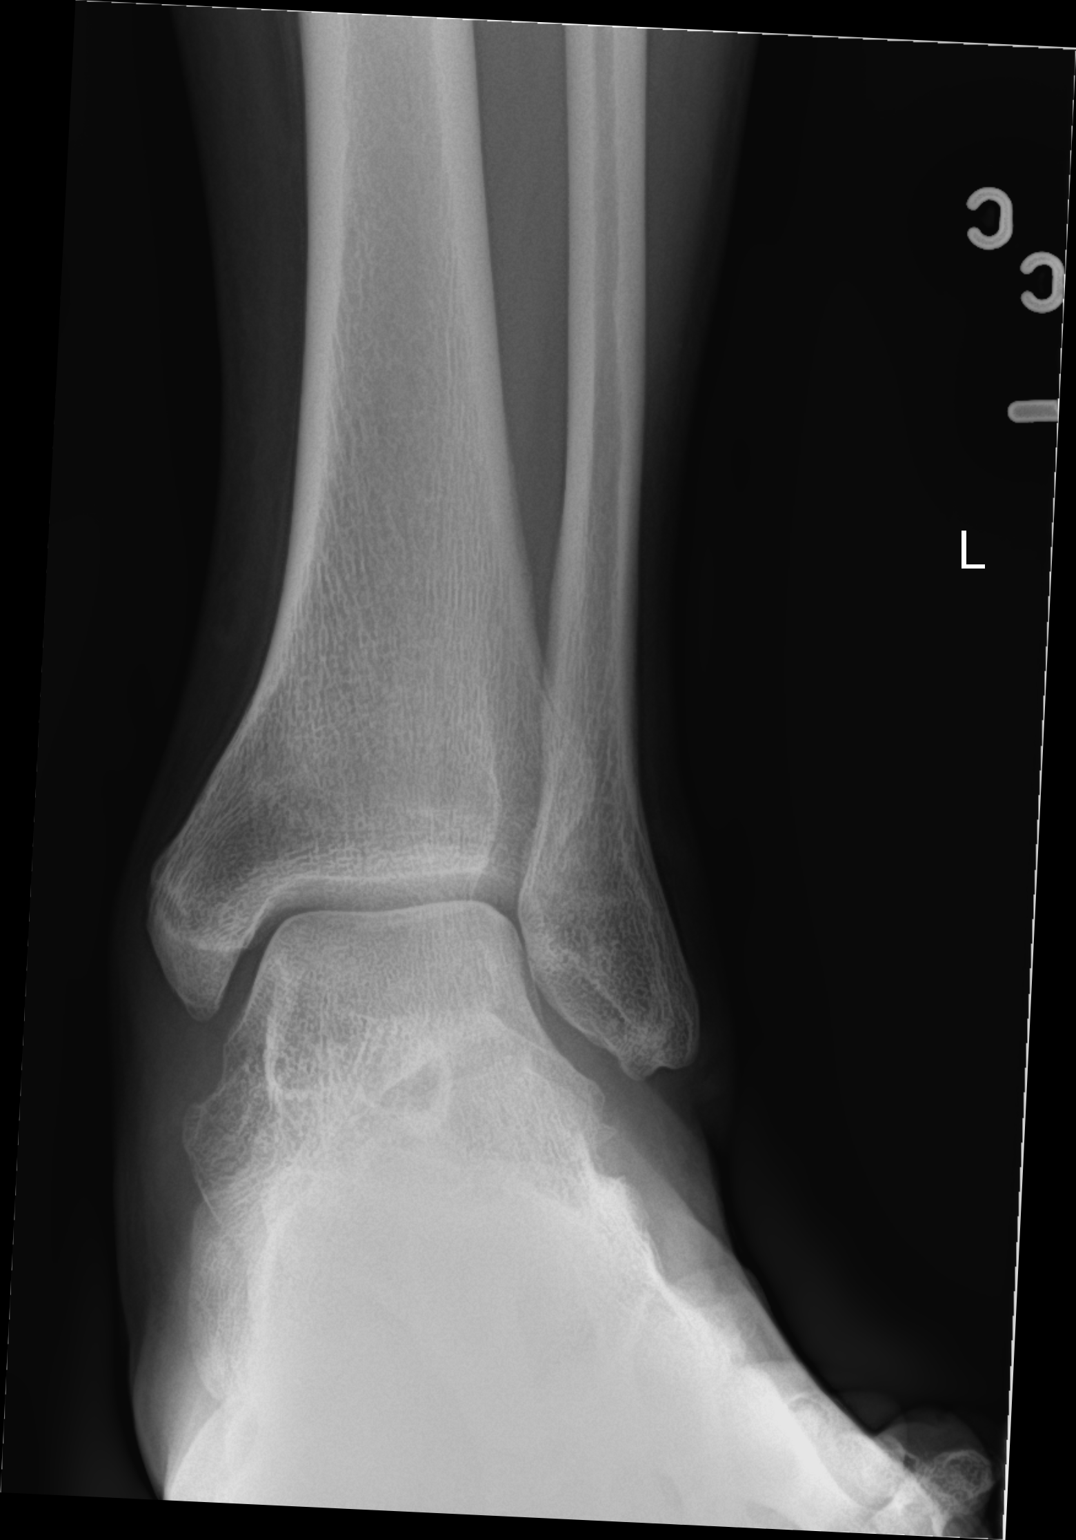

[1 of 1 positions shown; findings below may reference images not displayed]

DIAGNOSTIC STUDIES

EXAM

XR ankle LT min 3V

INDICATION

Left foot pian, numbness, no known injury. AC/CF

TECHNIQUE

AP lateral oblique views left ankle

COMPARISONS

None available

FINDINGS

No fractures or dislocations are seen. Joint spaces are well maintained.

IMPRESSION

No fractures are seen.

Tech Notes:

Left foot pian, numbness, no known injury. AC/CF

## 2021-05-21 IMAGING — CR SCAPULALT
2 series · 2 of 2 positions shown · non-contrast
Comparison: none

[scapula lat]
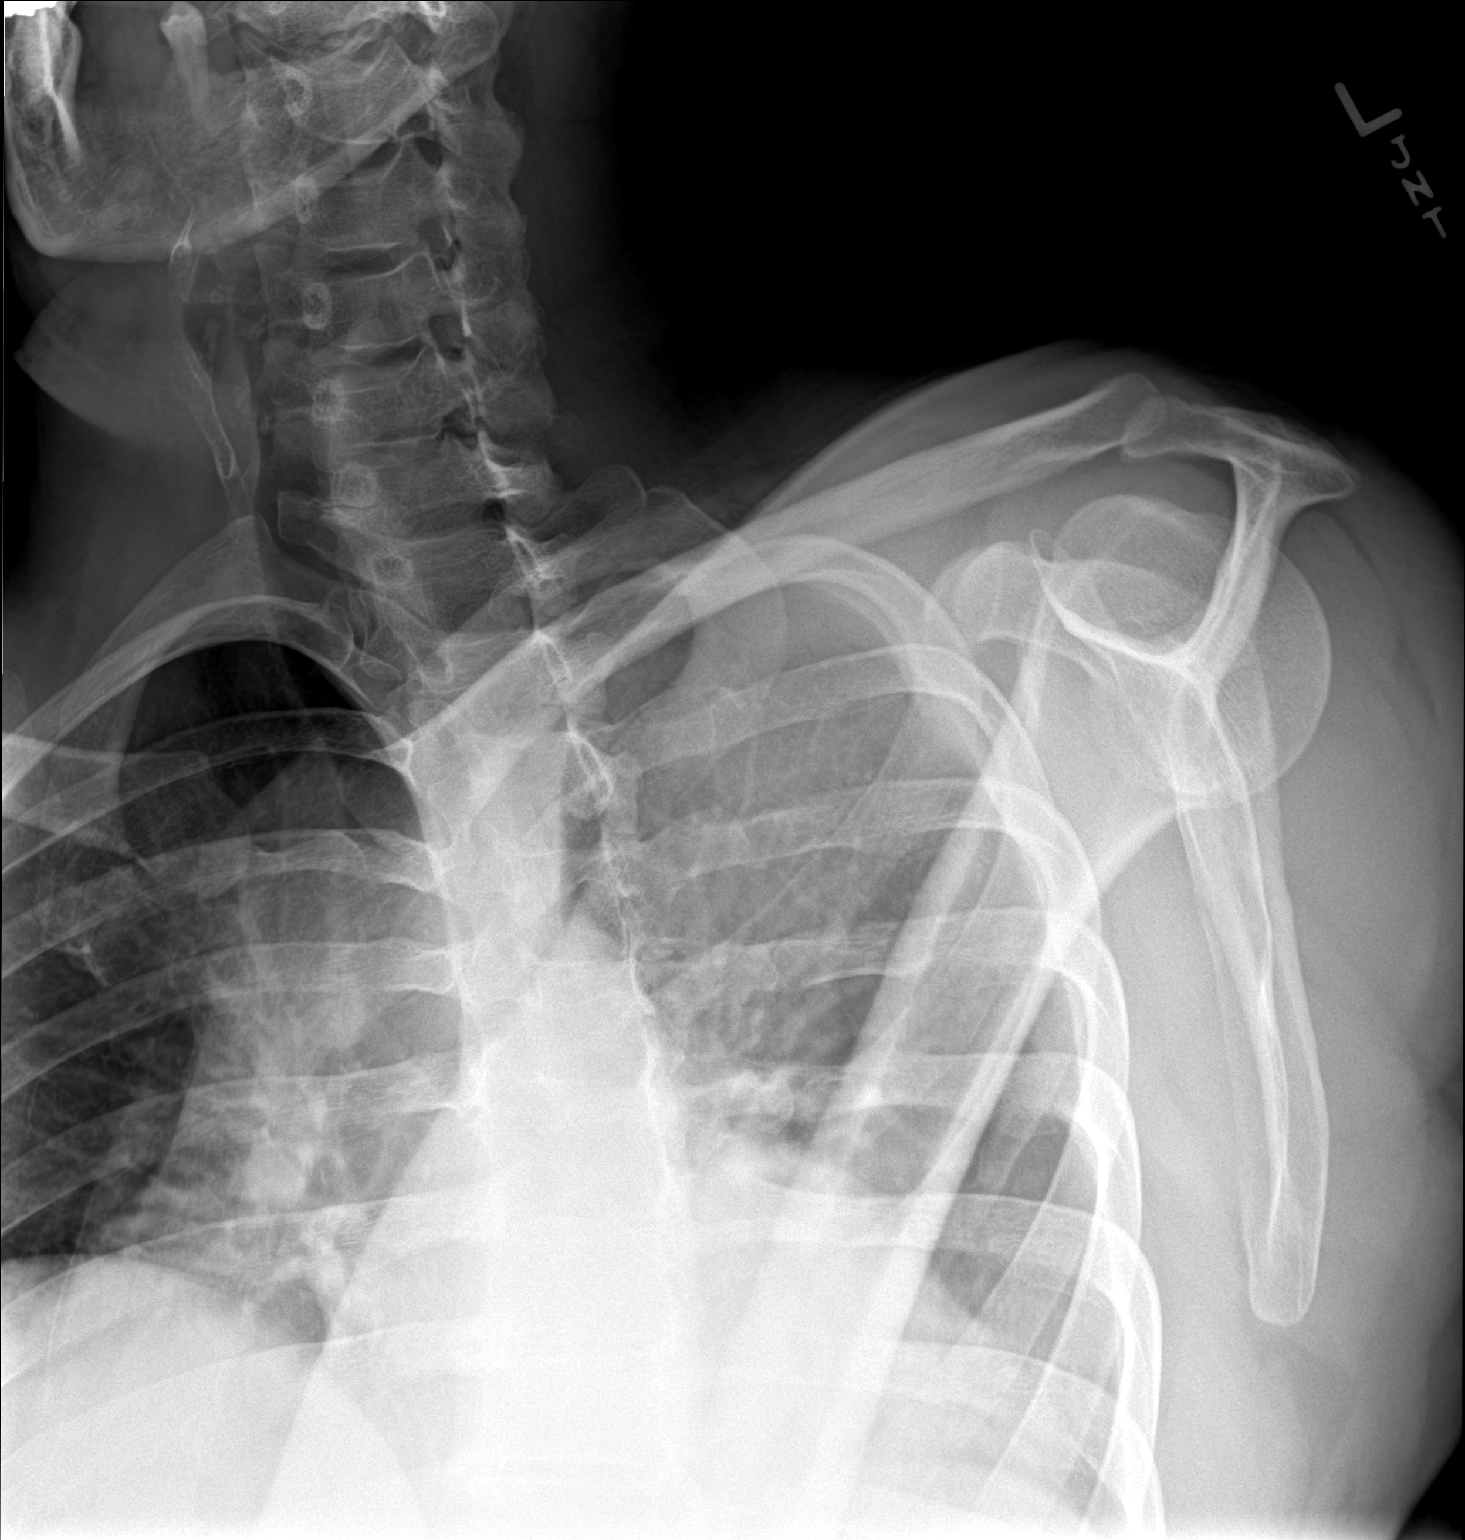

[scapula ap]
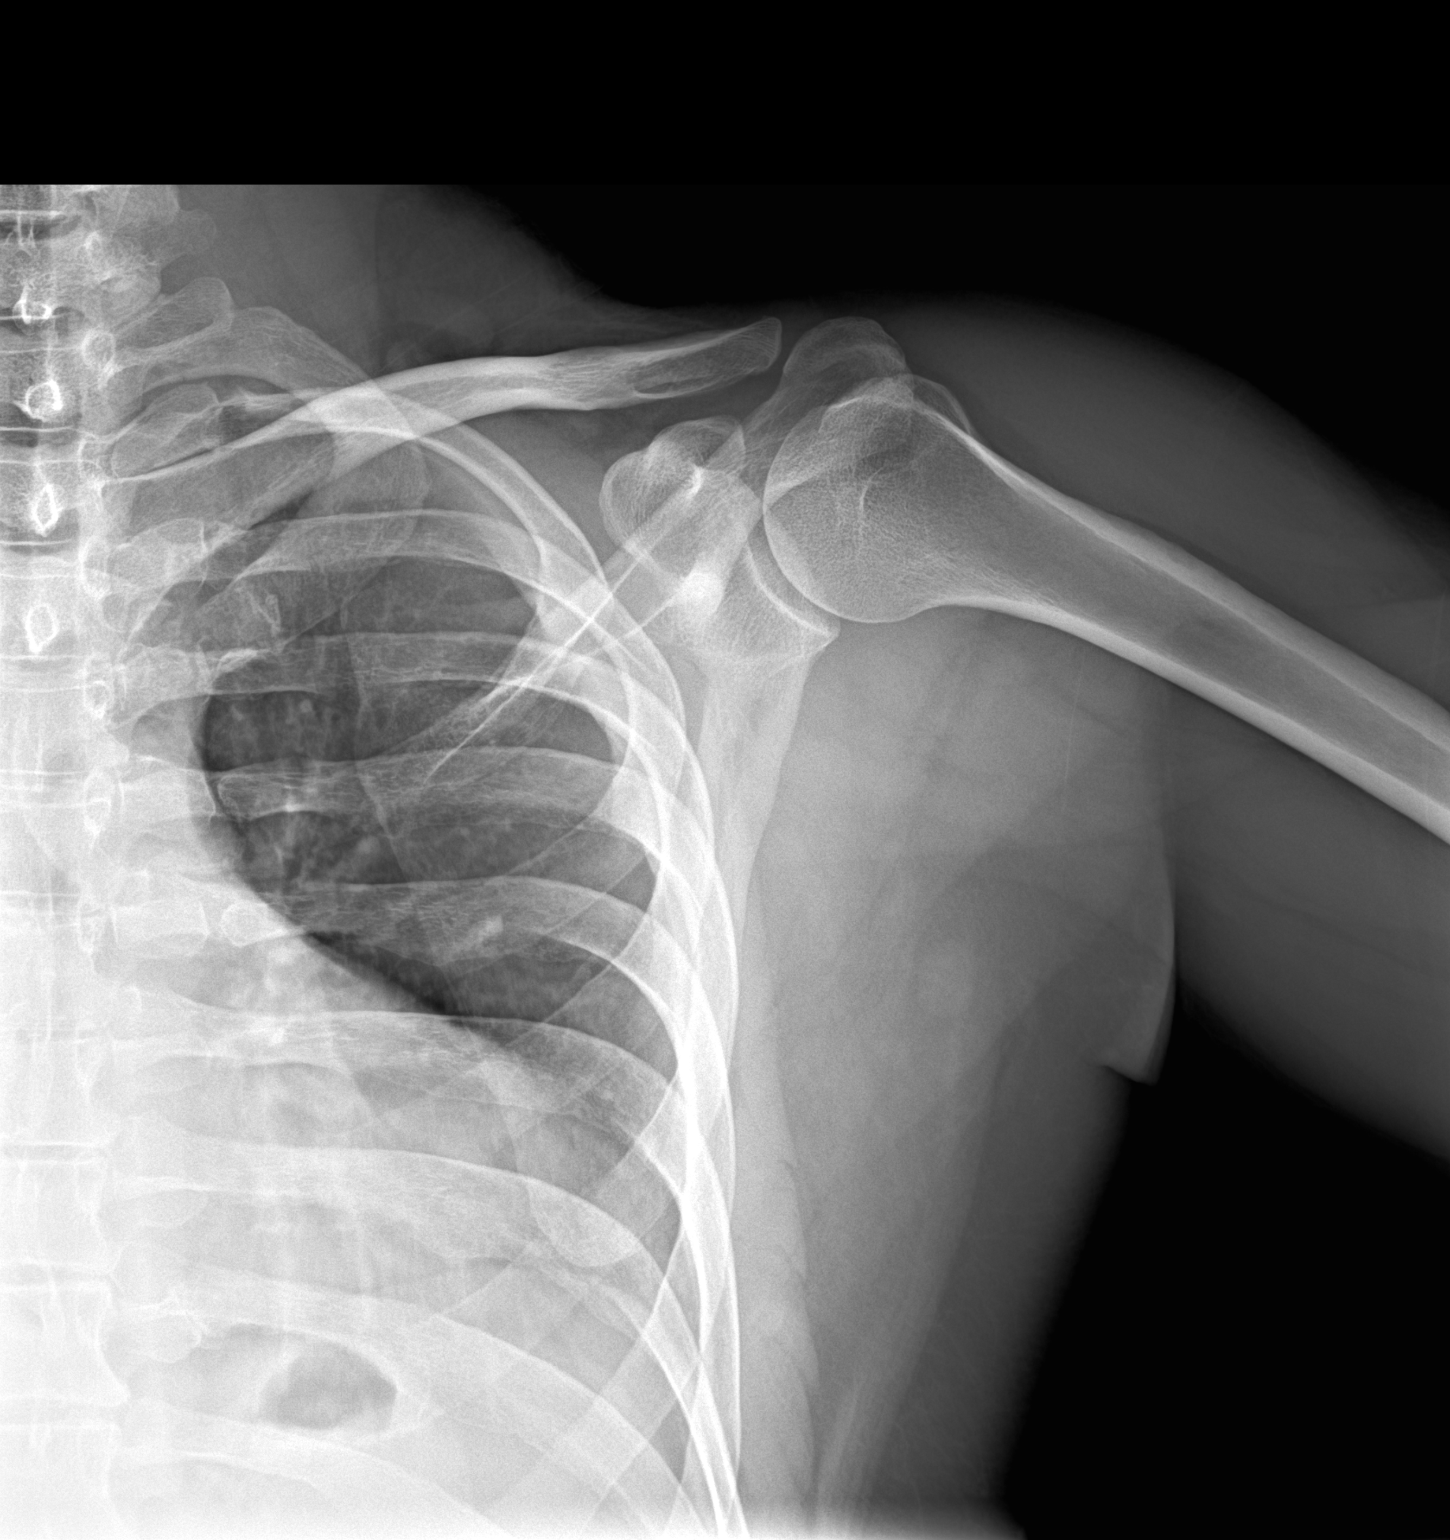

[2 of 2 positions shown; findings below may reference images not displayed]

DIAGNOSTIC STUDIES

EXAM

Three views of the left shoulder, two views of the left scapula

INDICATION

PAIN
Pt c/o left foot pain x 3-4 years, pain and swelling on medial side, pt states the pain goes up the
left side of his body into his shoulder. JT

TECHNIQUE

Left shoulder and scapular radiographs

COMPARISONS

None

FINDINGS

There is no fracture or dislocation of the left shoulder/scapula. The joint spaces are maintained.
Normal osseous mineralization.

IMPRESSION

No acute osseous findings.

Tech Notes:

Pt c/o left foot pain x 3-4 years, pain and swelling on medial side, pt states the pain goes up the
left side of his body into his shoulder. JT

## 2021-05-21 IMAGING — CR PAPERORDER
3 series · 3 of 3 positions shown · non-contrast
Comparison: none

[cspine ap]
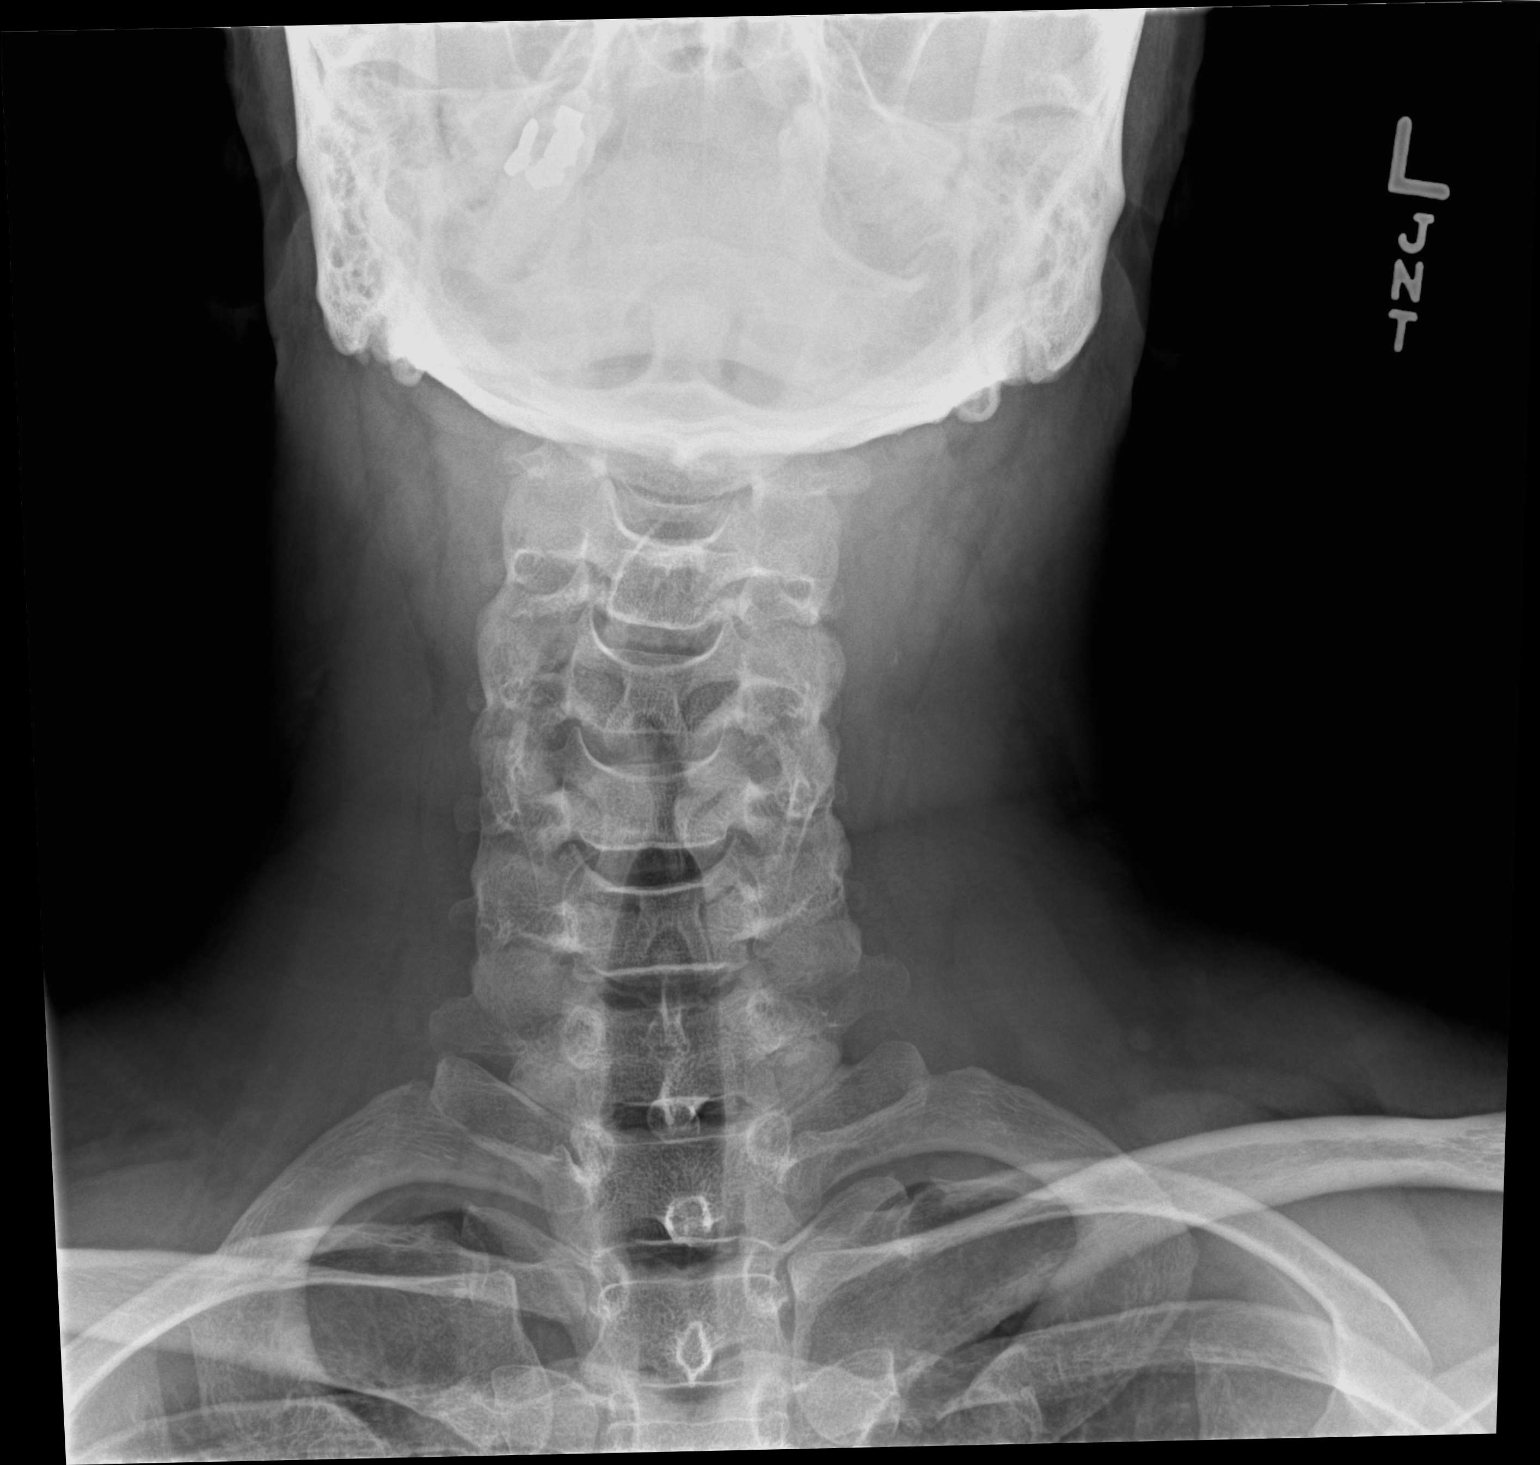

[cspine odontoid]
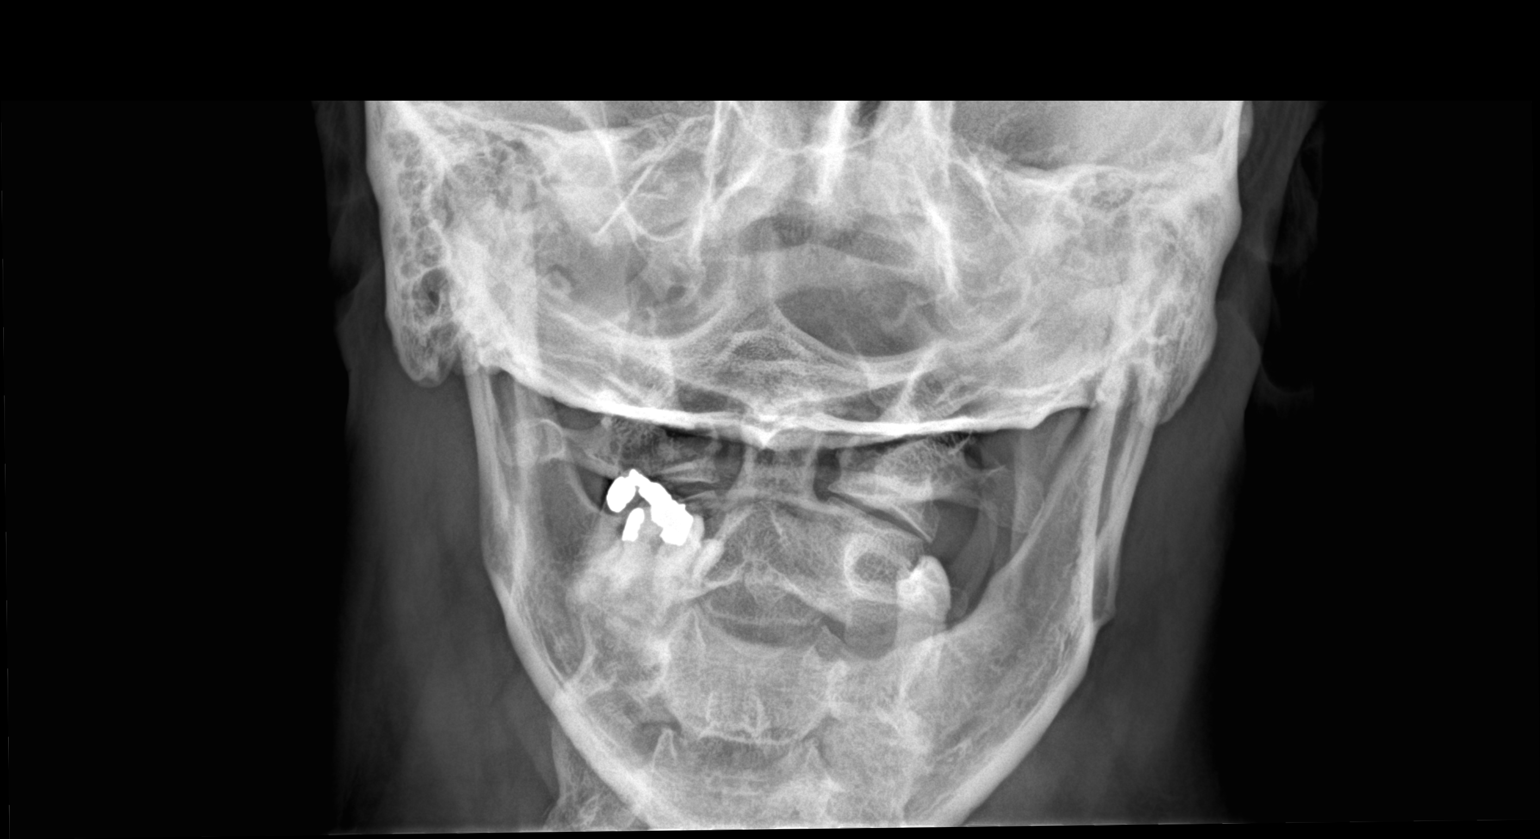

[cspine lat]
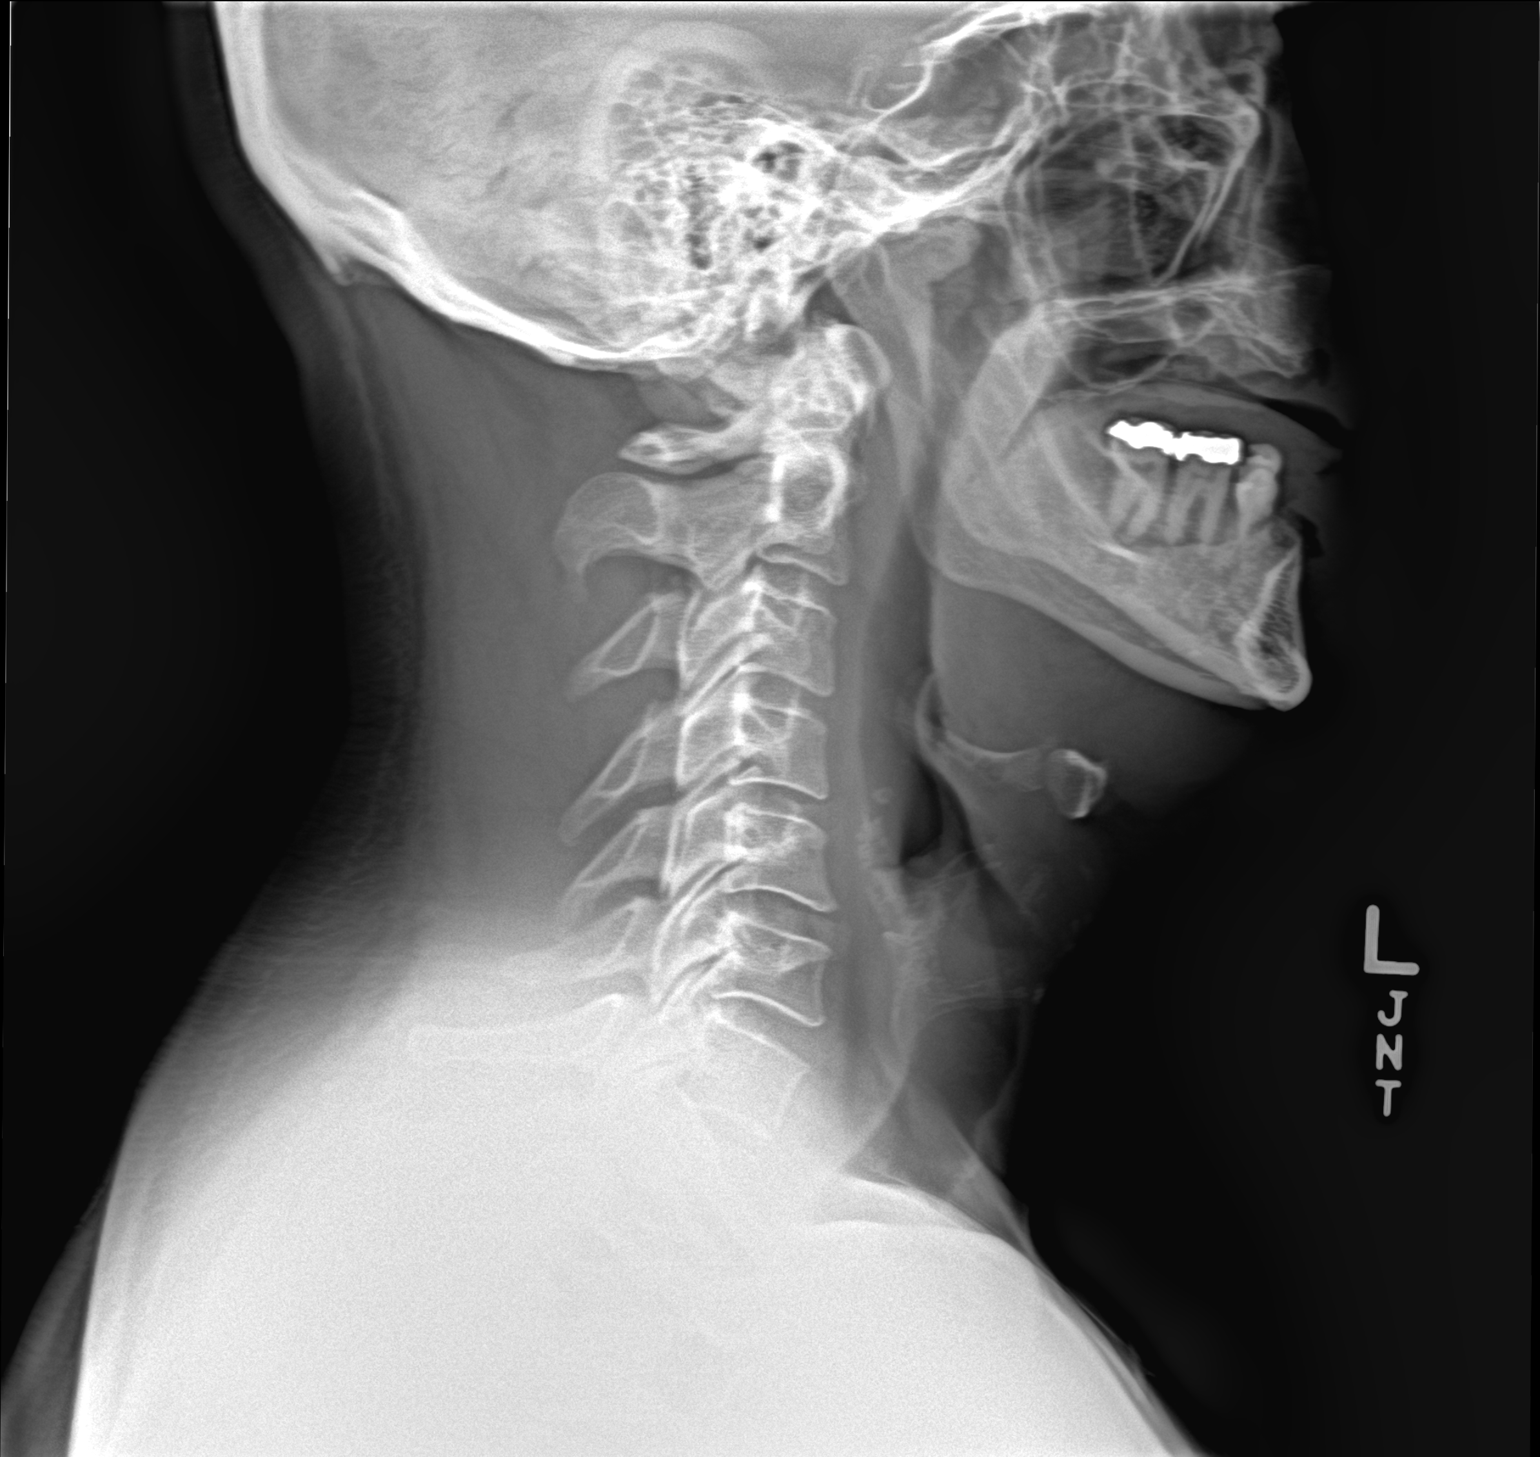

[3 of 3 positions shown; findings below may reference images not displayed]

DIAGNOSTIC STUDIES

EXAM

XR cervical spineB52

INDICATION

Pt c/o left foot pain x 3-4 years, pain and swelling on medial side, pt states the pain goes up the
left side of his body into his shoulder. JT

TECHNIQUE

Three views

COMPARISONS

None

FINDINGS

No fracture or listhesis. The intervertebral disc heights are maintained. The upper odontoid is
partially obscured on the odontoid view, however no fractures identified. Prevertebral soft tissues
are unremarkable. Predental space is maintained.

IMPRESSION

No acute osseous findings.

Tech Notes:

Pt c/o left foot pain x 3-4 years, pain and swelling on medial side, pt states the pain goes up the
left side of his body into his shoulder. JT

## 2021-05-21 IMAGING — CR [ID]
3 series · 3 of 3 positions shown · non-contrast
Comparison: none

[foot ap]
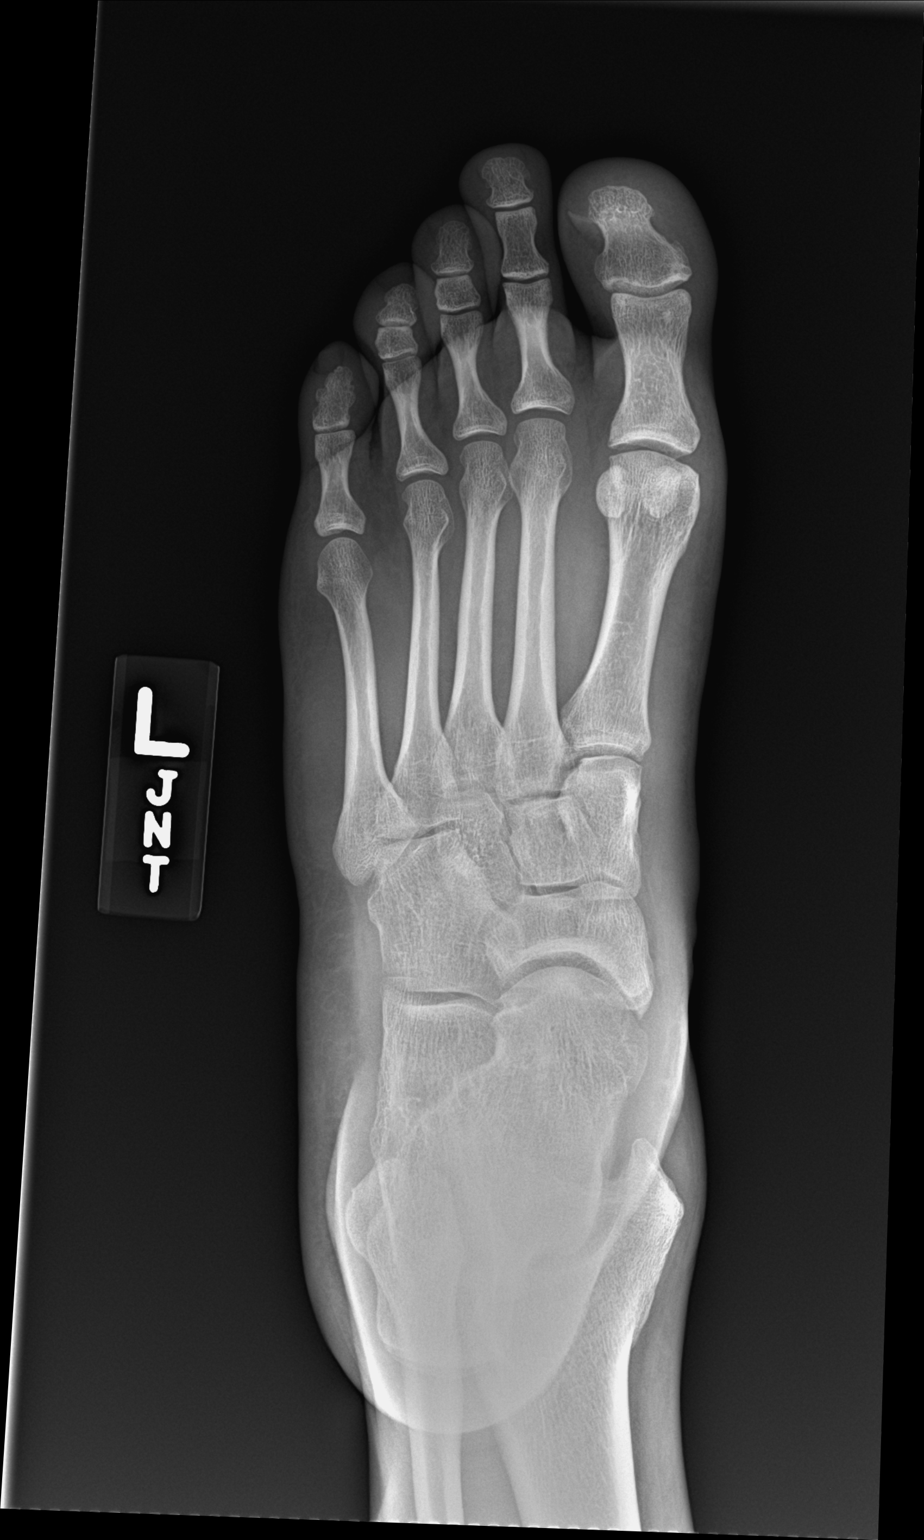

[foot obl]
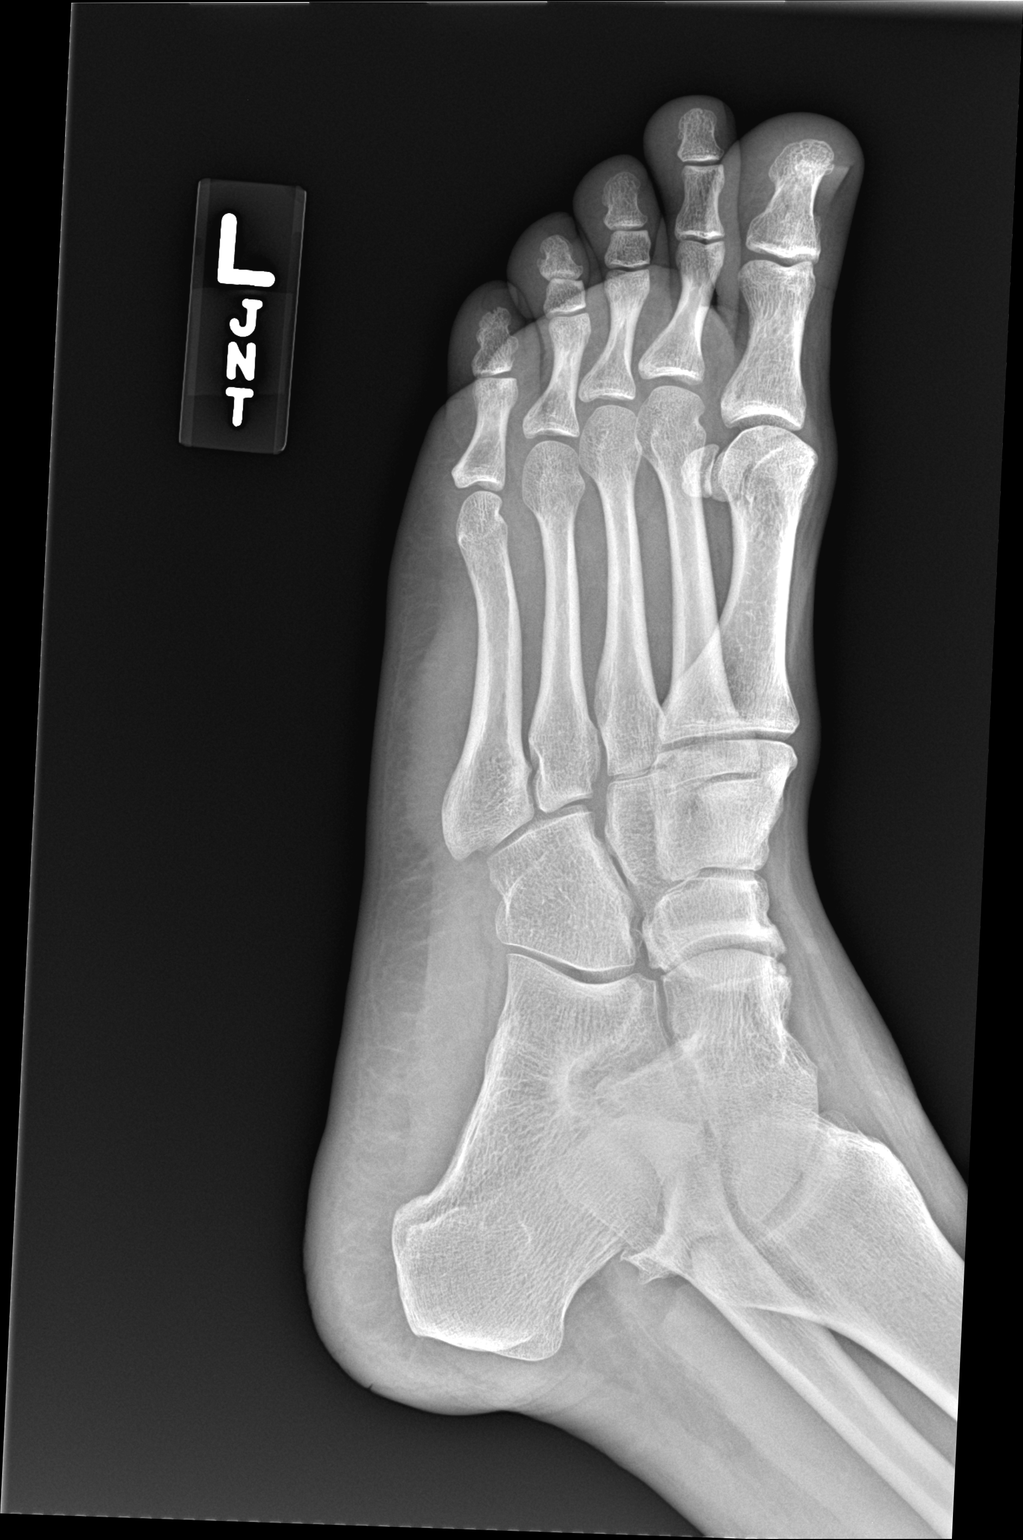

[foot lat]
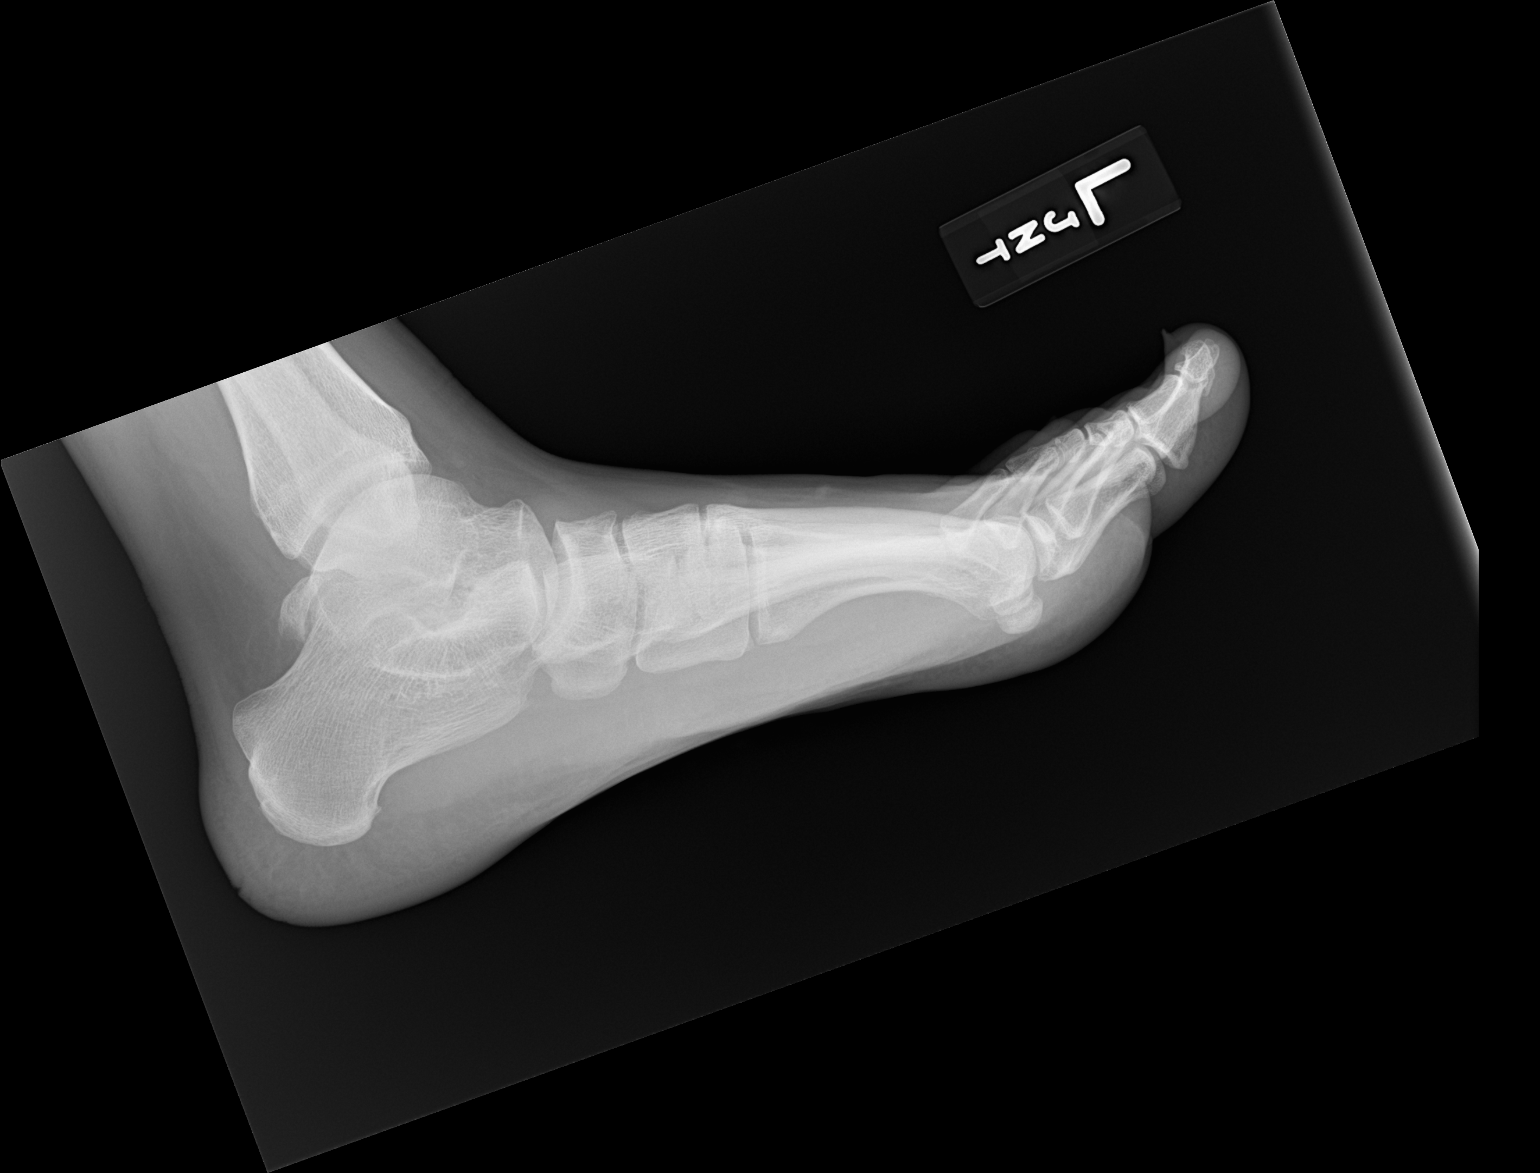

[3 of 3 positions shown; findings below may reference images not displayed]

DIAGNOSTIC STUDIES

EXAM

XR foot LT min 3V

INDICATION

foot pain
Pt c/o left foot pain x 3-4 years, pain and swelling on medial side, pt states the pain goes up the
left side of his body into his shoulder. JT

TECHNIQUE

Three views

COMPARISONS

None

FINDINGS

Pes planus. No fracture or dislocation. Minimal navicular osteophytes, otherwise joint spaces are
well maintained. Soft tissues are unremarkable.

IMPRESSION

No acute osseous findings.

Pes planus.

Minimal midfoot degenerative change.

Tech Notes:

Pt c/o left foot pain x 3-4 years, pain and swelling on medial side, pt states the pain goes up the
left side of his body into his shoulder. JT

## 2021-05-21 IMAGING — CR [ID]
3 series · 3 of 3 positions shown · non-contrast
Comparison: none

[tspine ap]
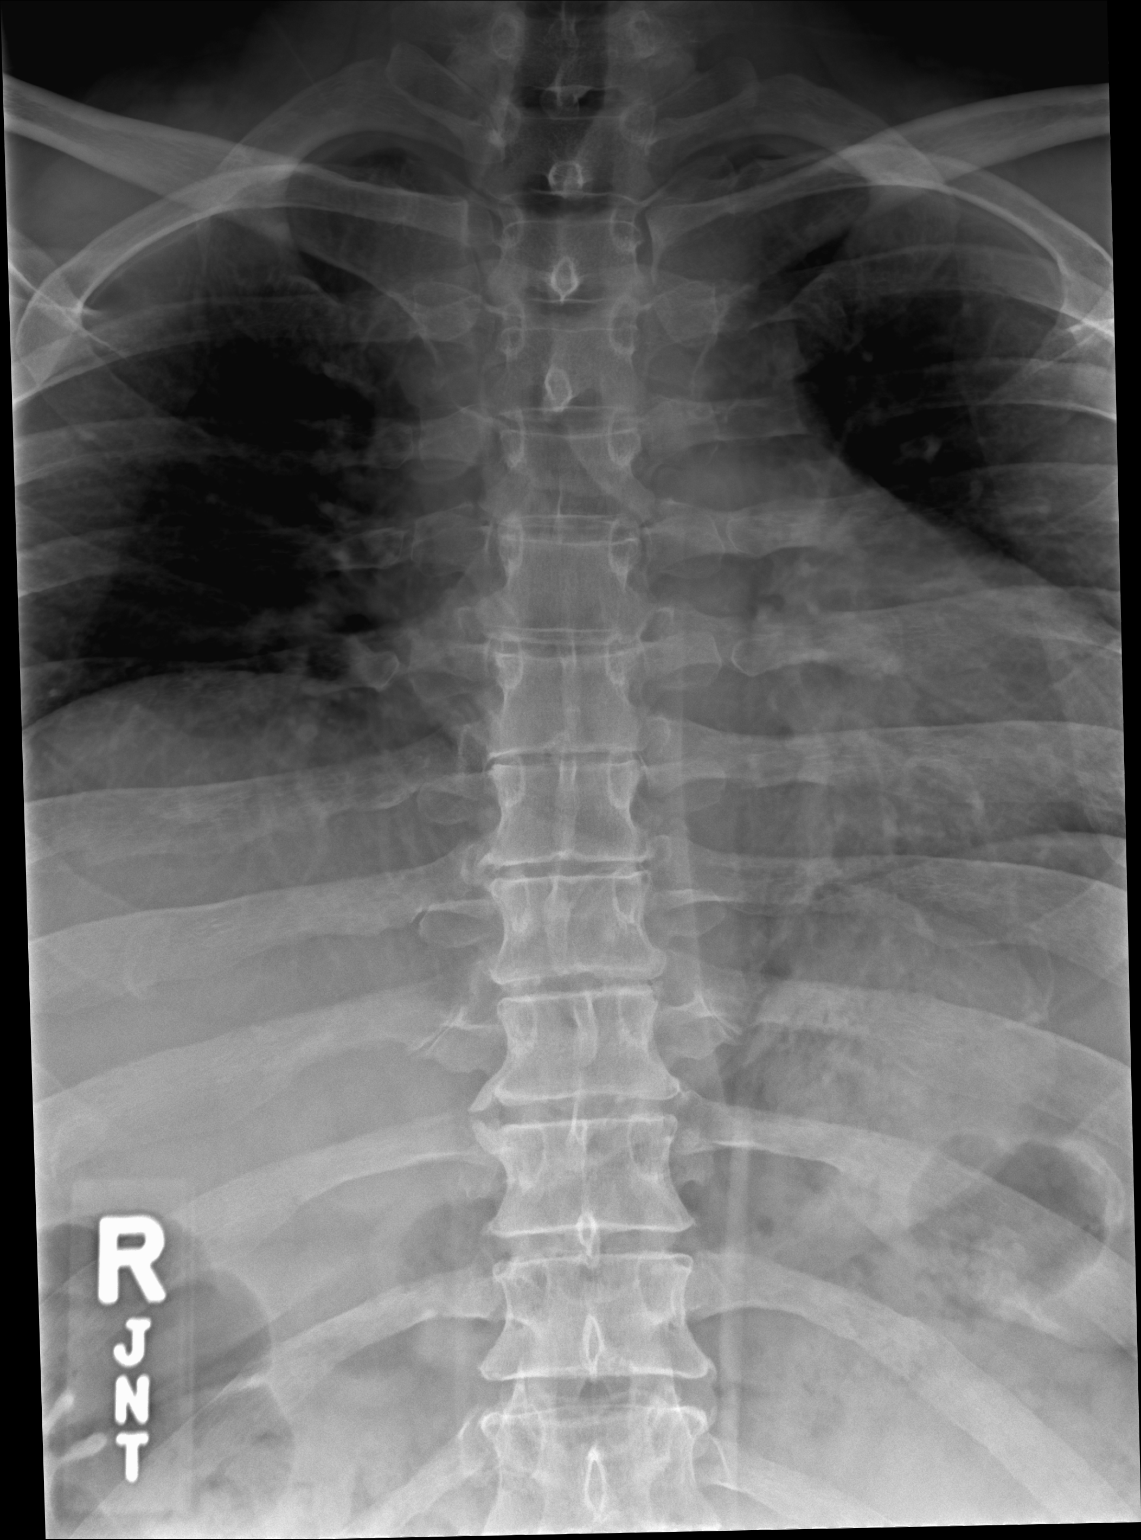

[tspine lat breathing]
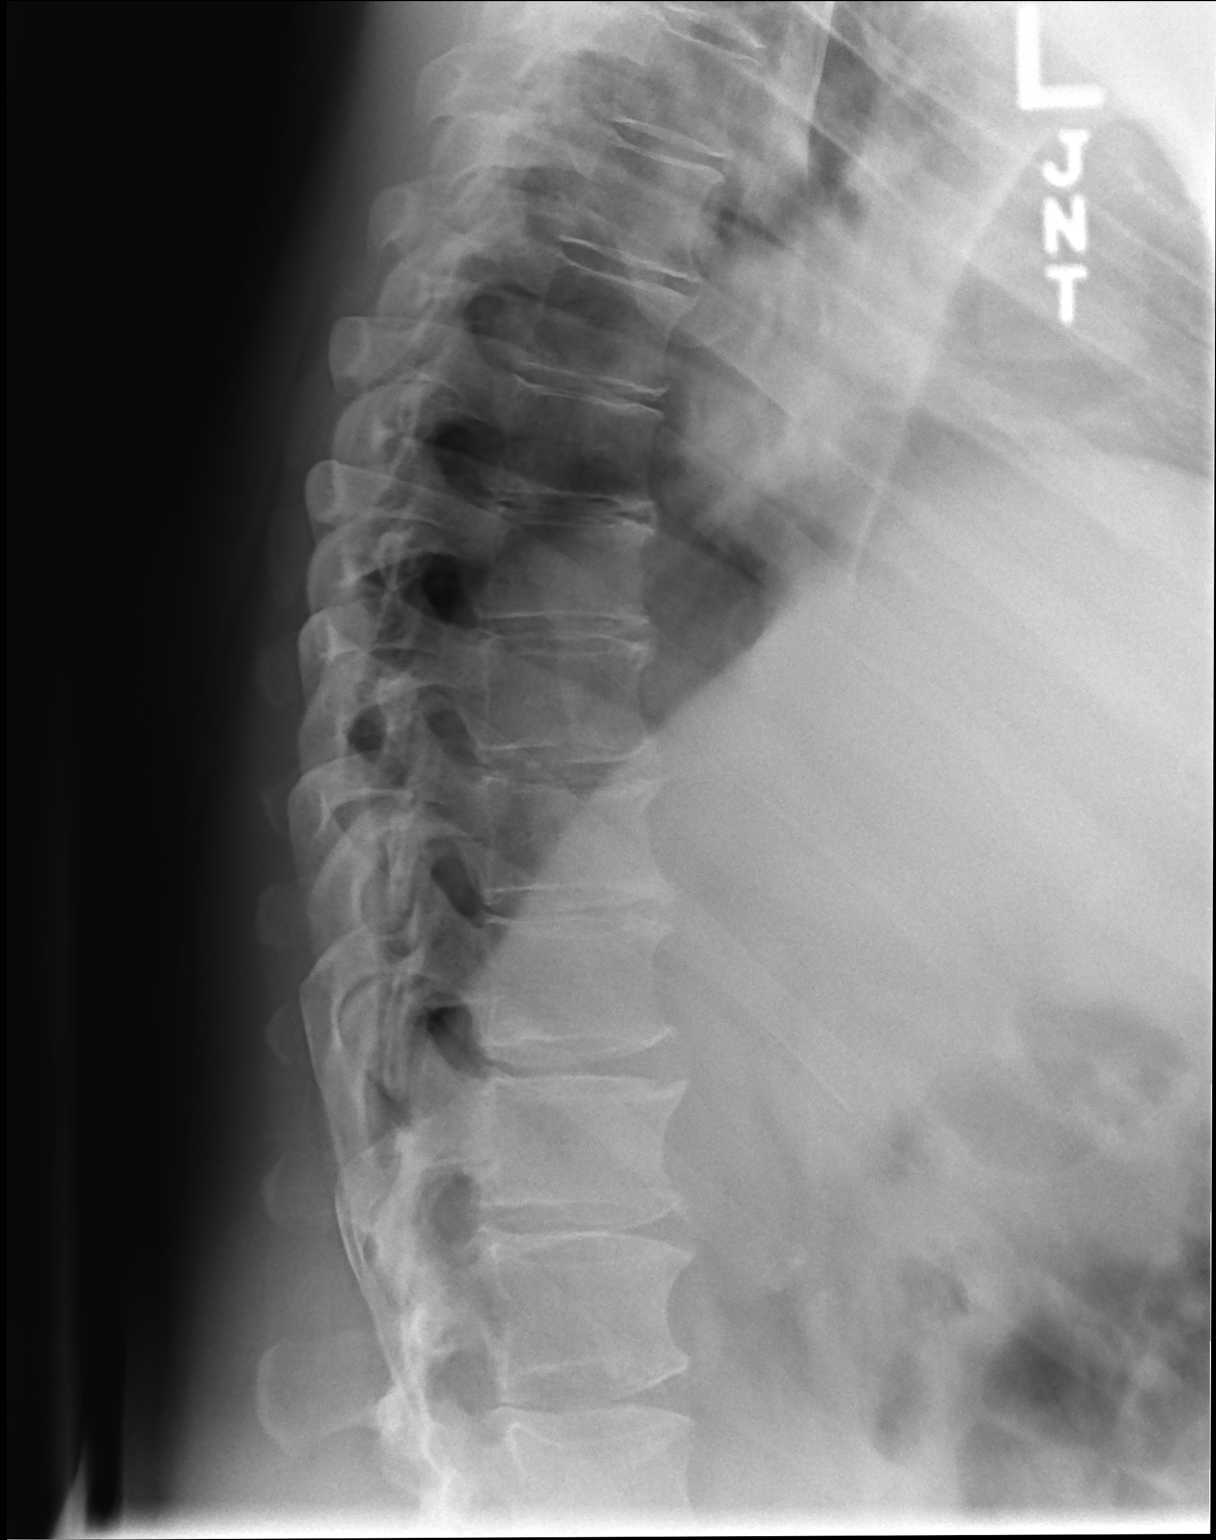

[tspine swimmers]
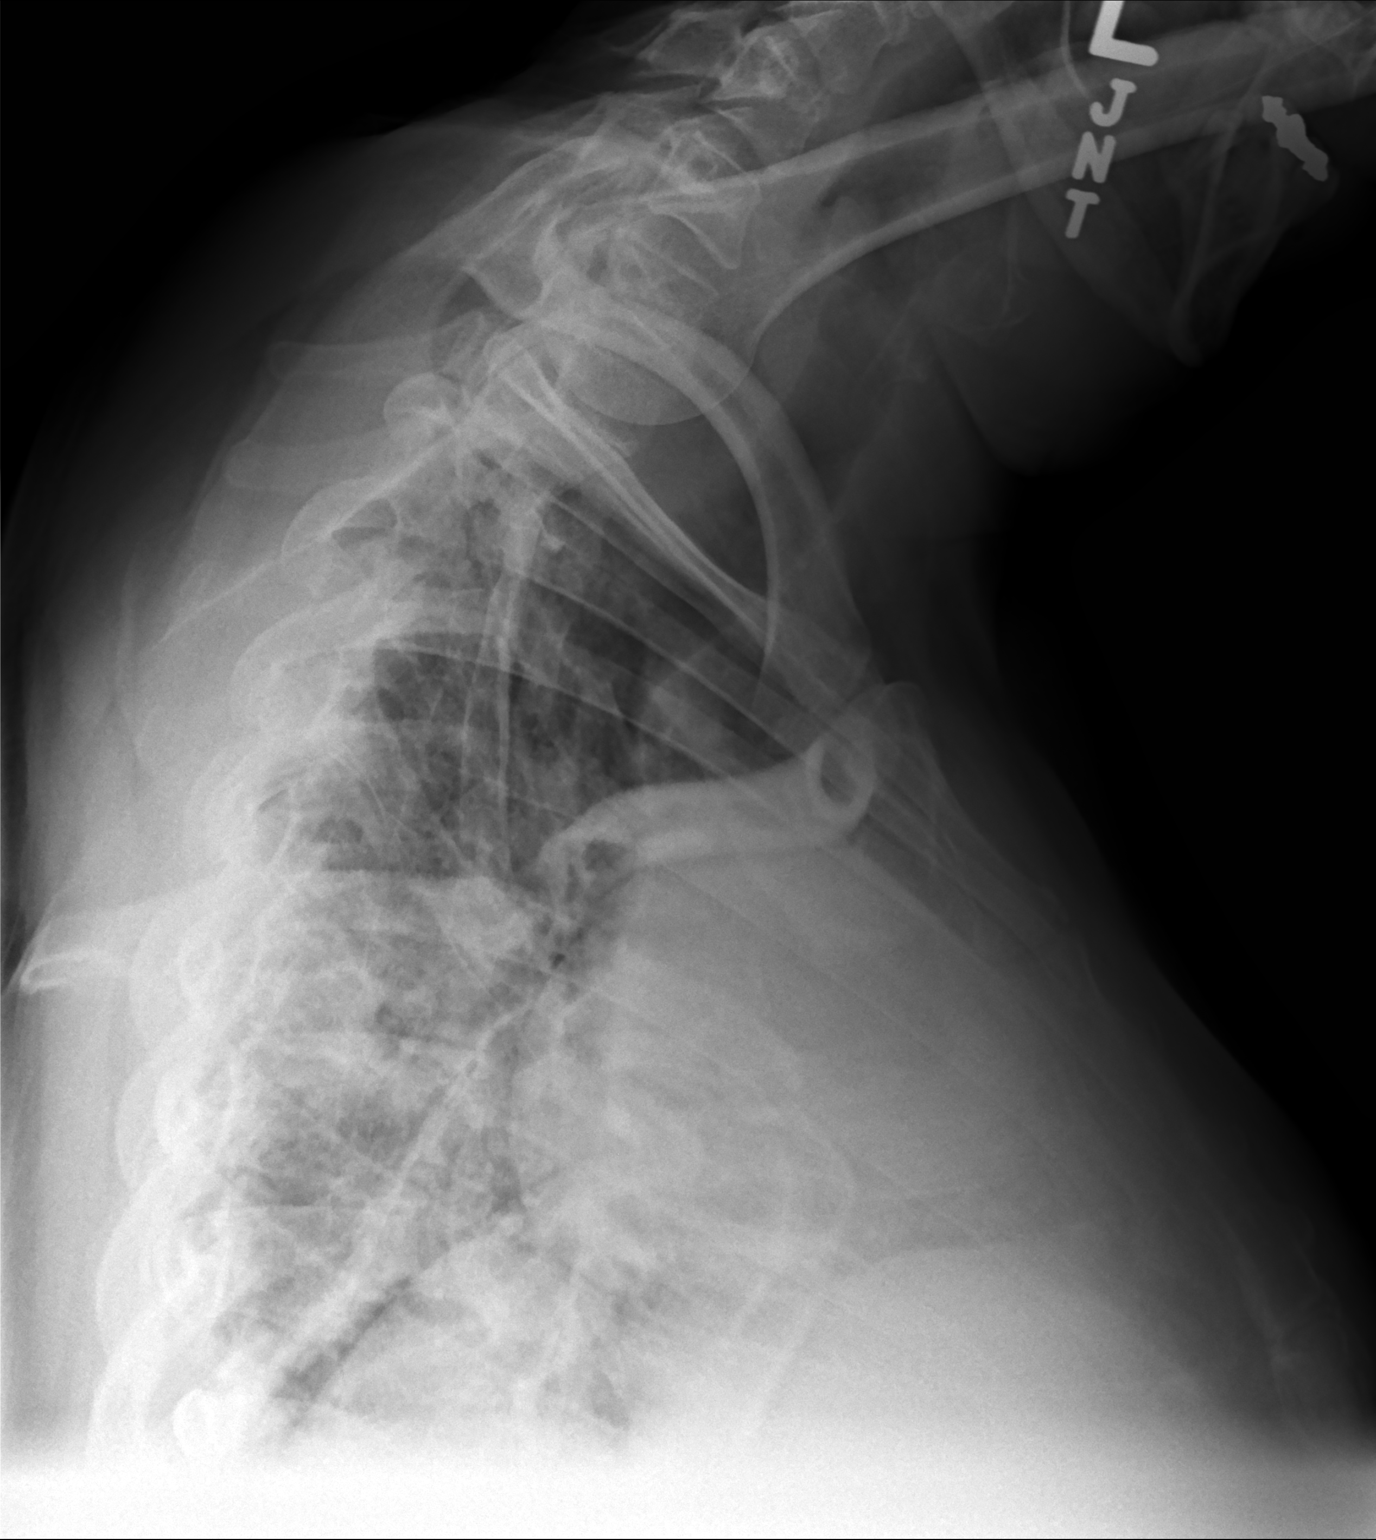

[3 of 3 positions shown; findings below may reference images not displayed]

DIAGNOSTIC STUDIES

EXAM

XR thoracic spine 3V

INDICATION

PAIN
Pt c/o left foot pain x 3-4 years, pain and swelling on medial side, pt states the pain goes up the
left side of his body into his shoulder. JT

TECHNIQUE

Three views

COMPARISONS

None

FINDINGS

There is moderate multilevel degenerative disc disease greater in the mid and lower thoracic spine.
Upper thoracic spine is partially obscured on lateral views. No fracture or listhesis.

IMPRESSION

Moderate degenerative disc disease.

Tech Notes:

Pt c/o left foot pain x 3-4 years, pain and swelling on medial side, pt states the pain goes up the
left side of his body into his shoulder. JT

## 2021-05-21 IMAGING — CR SHOULDCMLT
3 series · 3 of 3 positions shown · non-contrast
Comparison: none

[shoulder external]
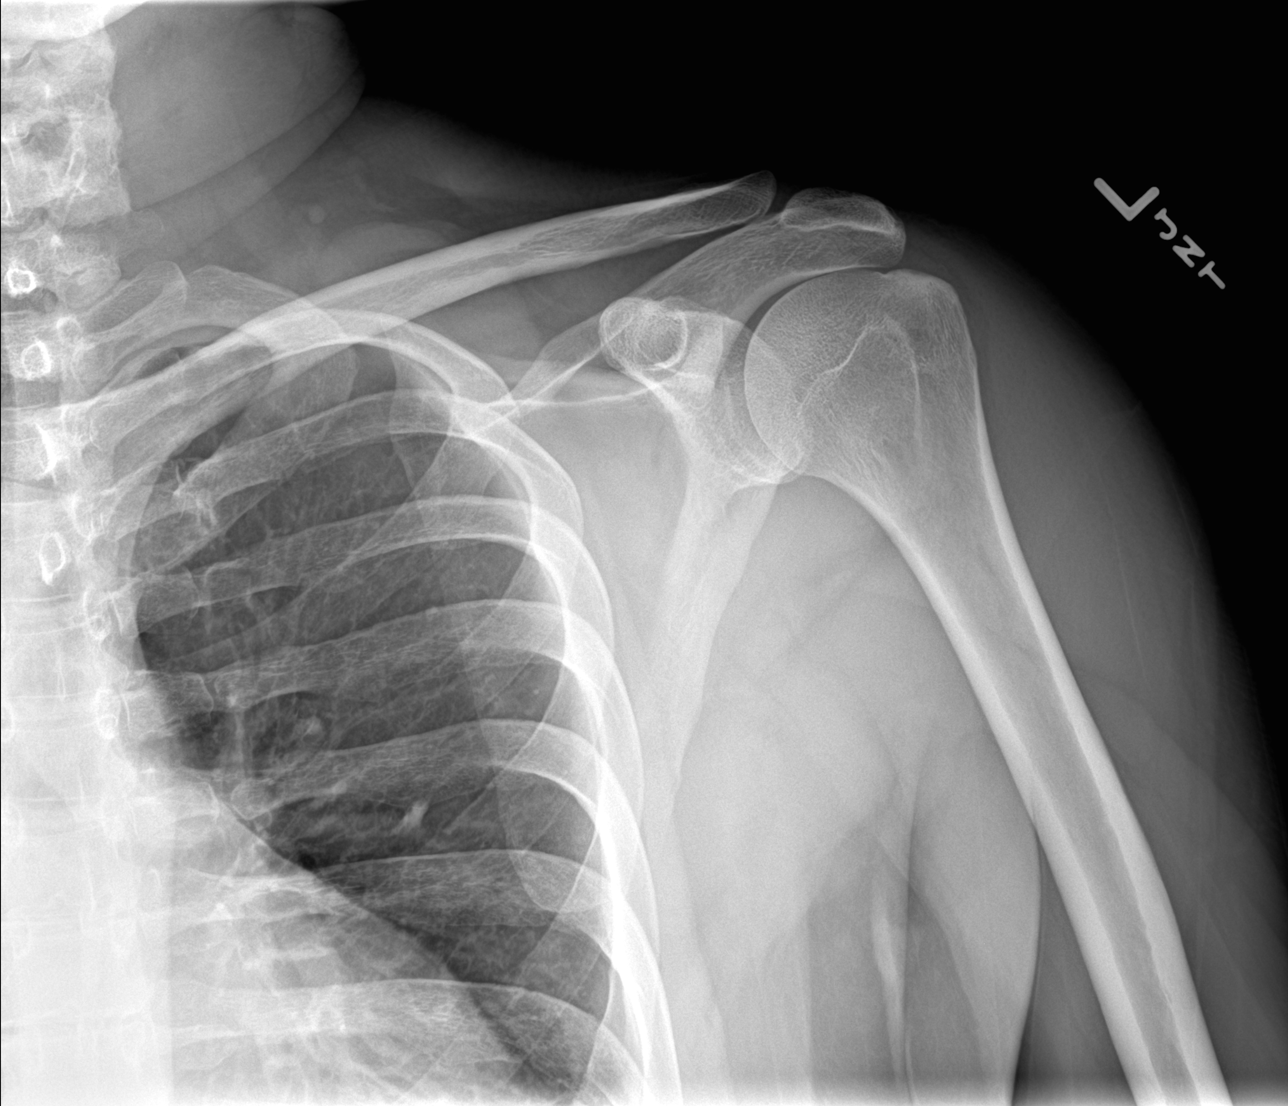

[shoulder internal]
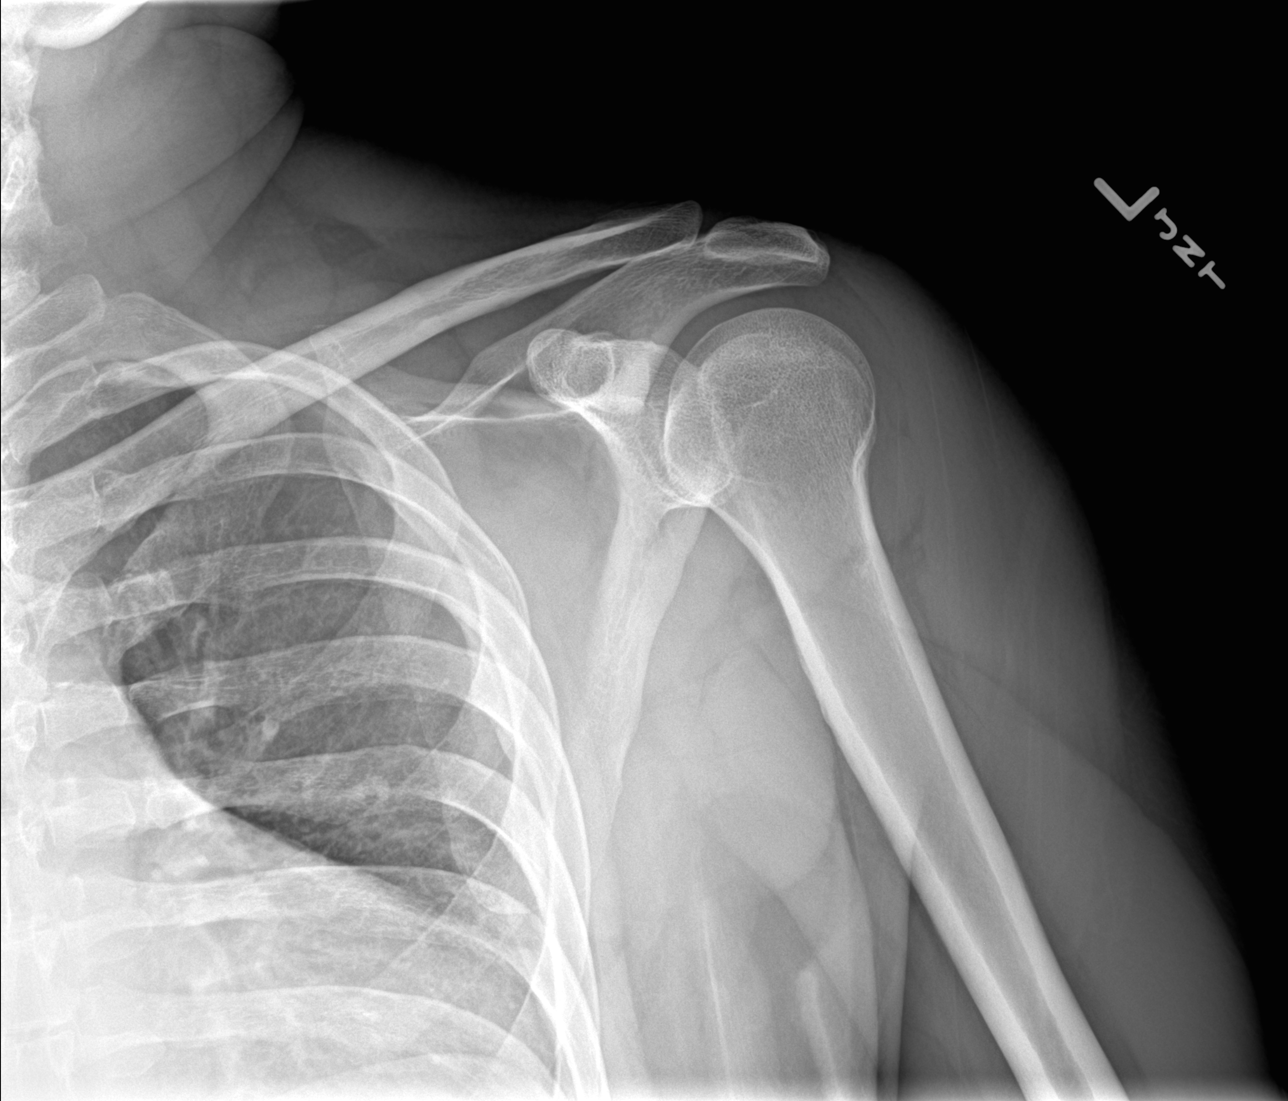

[shoulder y-view]
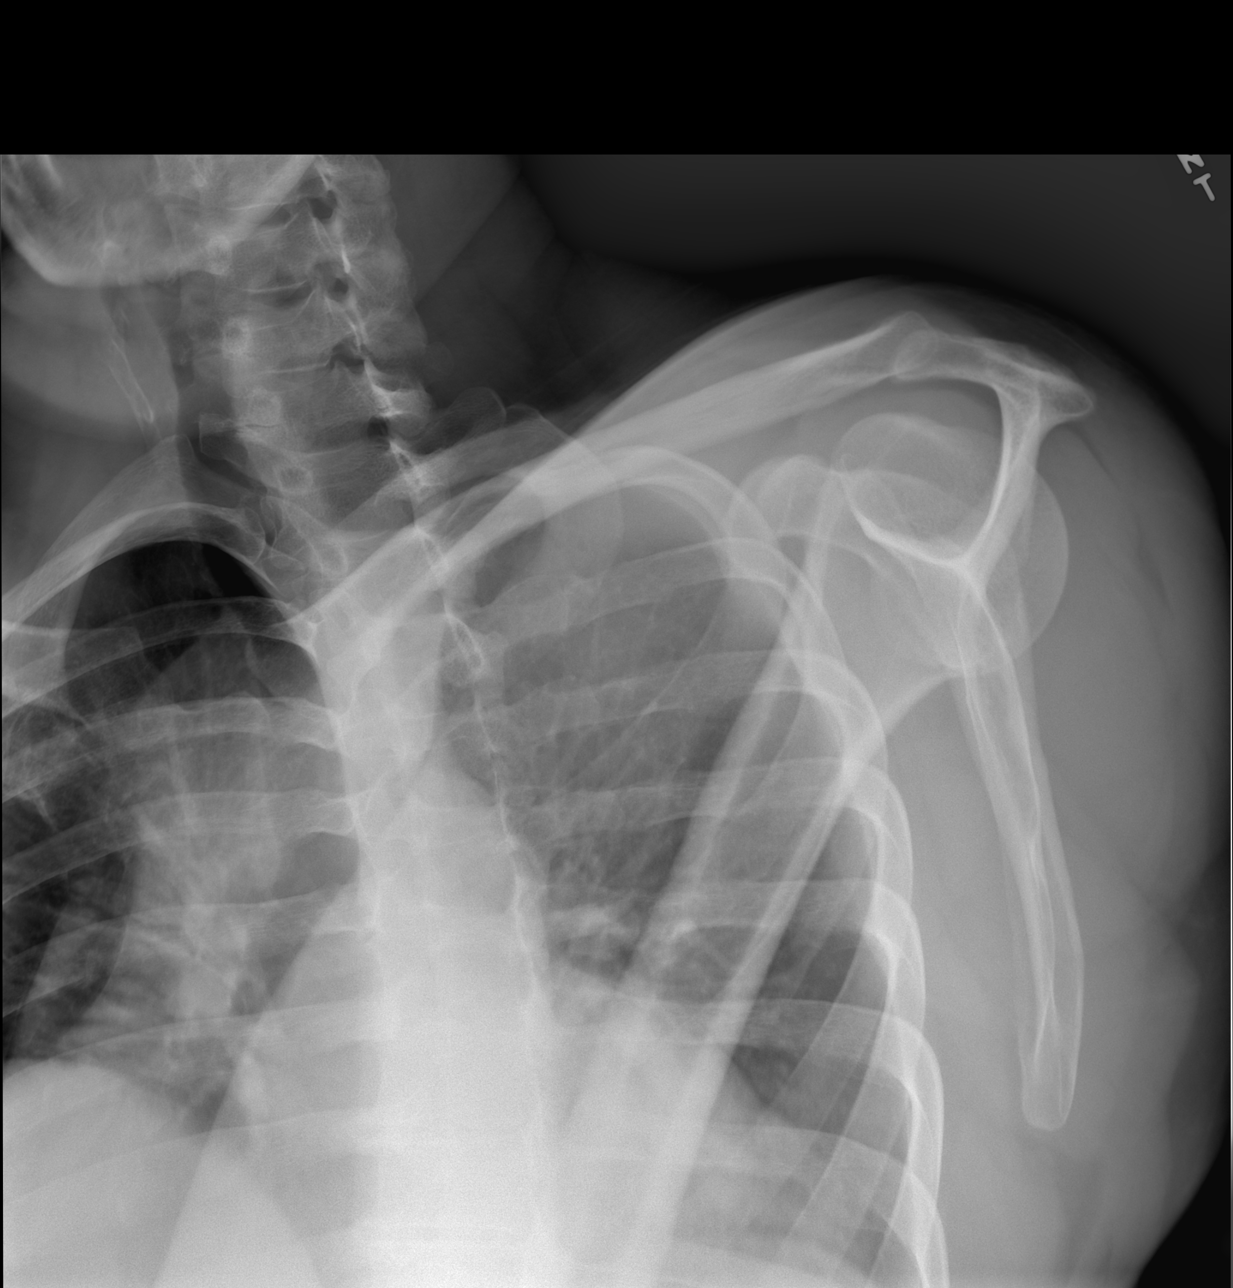

[3 of 3 positions shown; findings below may reference images not displayed]

DIAGNOSTIC STUDIES

EXAM

Three views of the left shoulder, two views of the left scapula

INDICATION

PAIN
Pt c/o left foot pain x 3-4 years, pain and swelling on medial side, pt states the pain goes up the
left side of his body into his shoulder. JT

TECHNIQUE

Left shoulder and scapular radiographs

COMPARISONS

None

FINDINGS

There is no fracture or dislocation of the left shoulder/scapula. The joint spaces are maintained.
Normal osseous mineralization.

IMPRESSION

No acute osseous findings.

Tech Notes:

Pt c/o left foot pain x 3-4 years, pain and swelling on medial side, pt states the pain goes up the
left side of his body into his shoulder. JT

## 2021-05-21 IMAGING — CR SPLUMBLM
3 series · 3 of 3 positions shown · non-contrast
Comparison: none

[lspine ap]
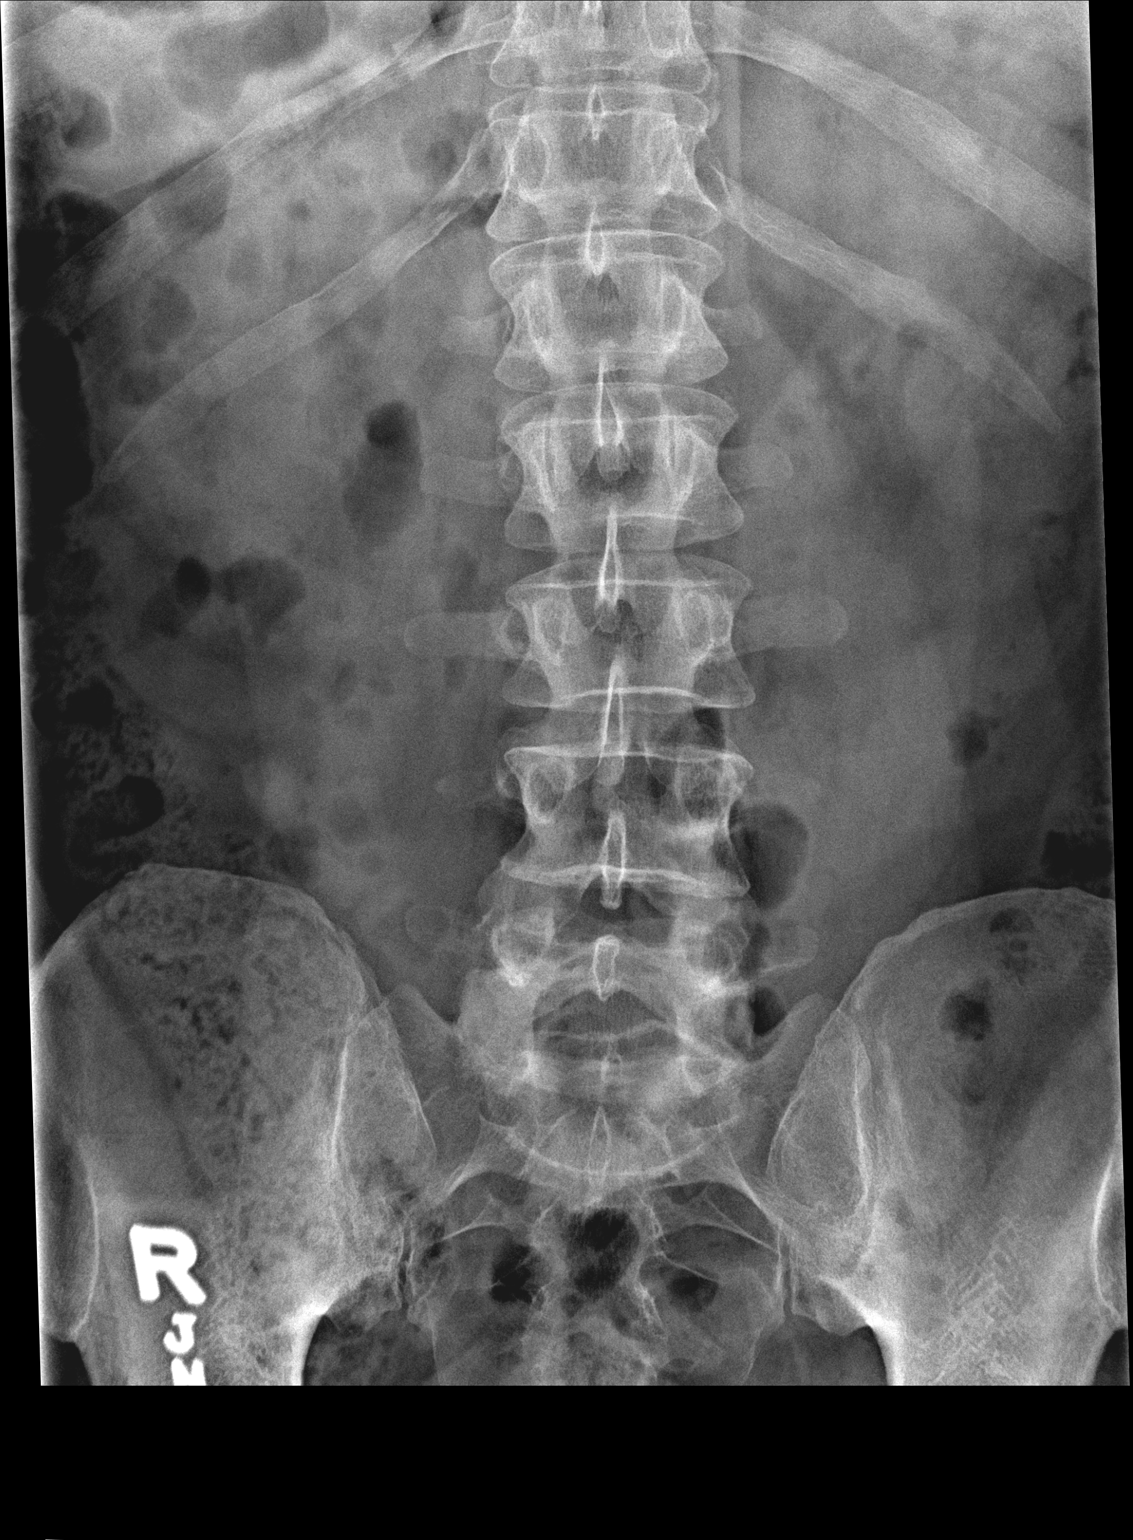

[lspine lat (1 of 2)]
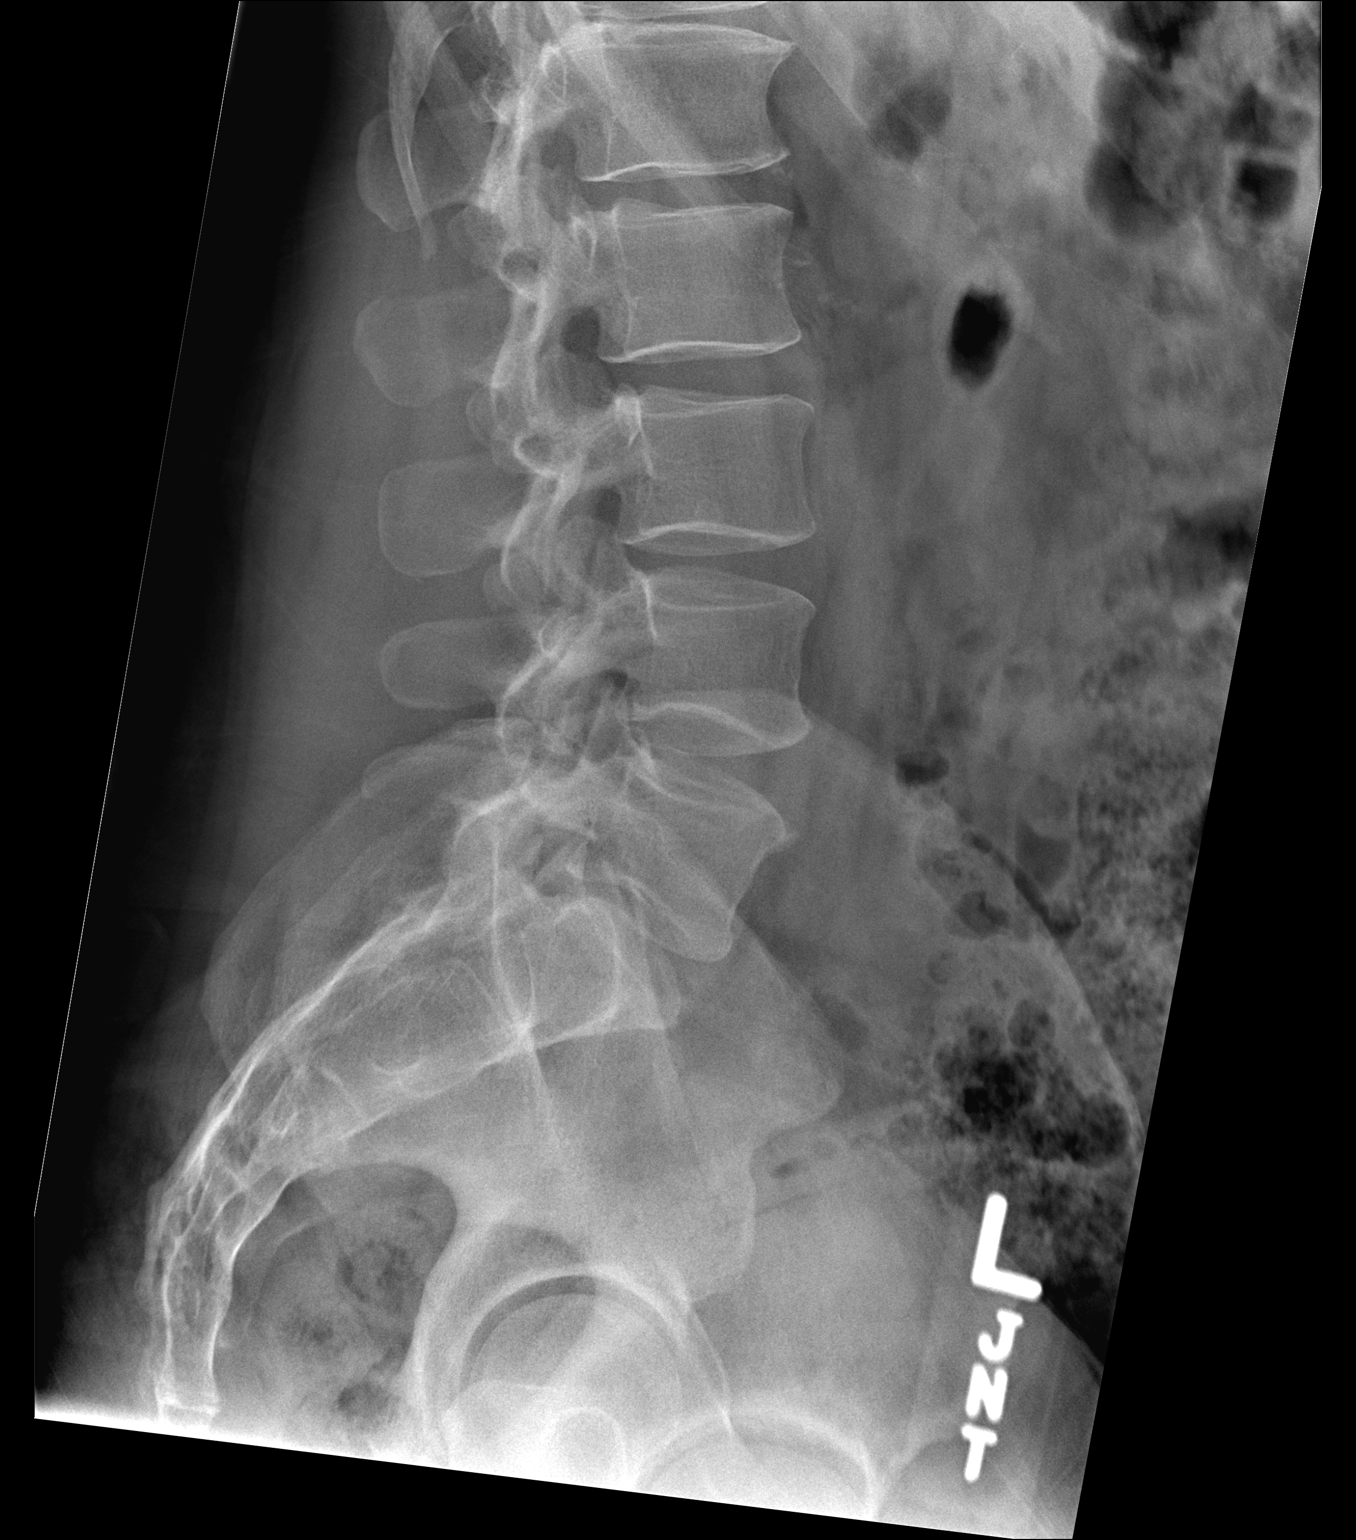

[lspine lat (2 of 2)]
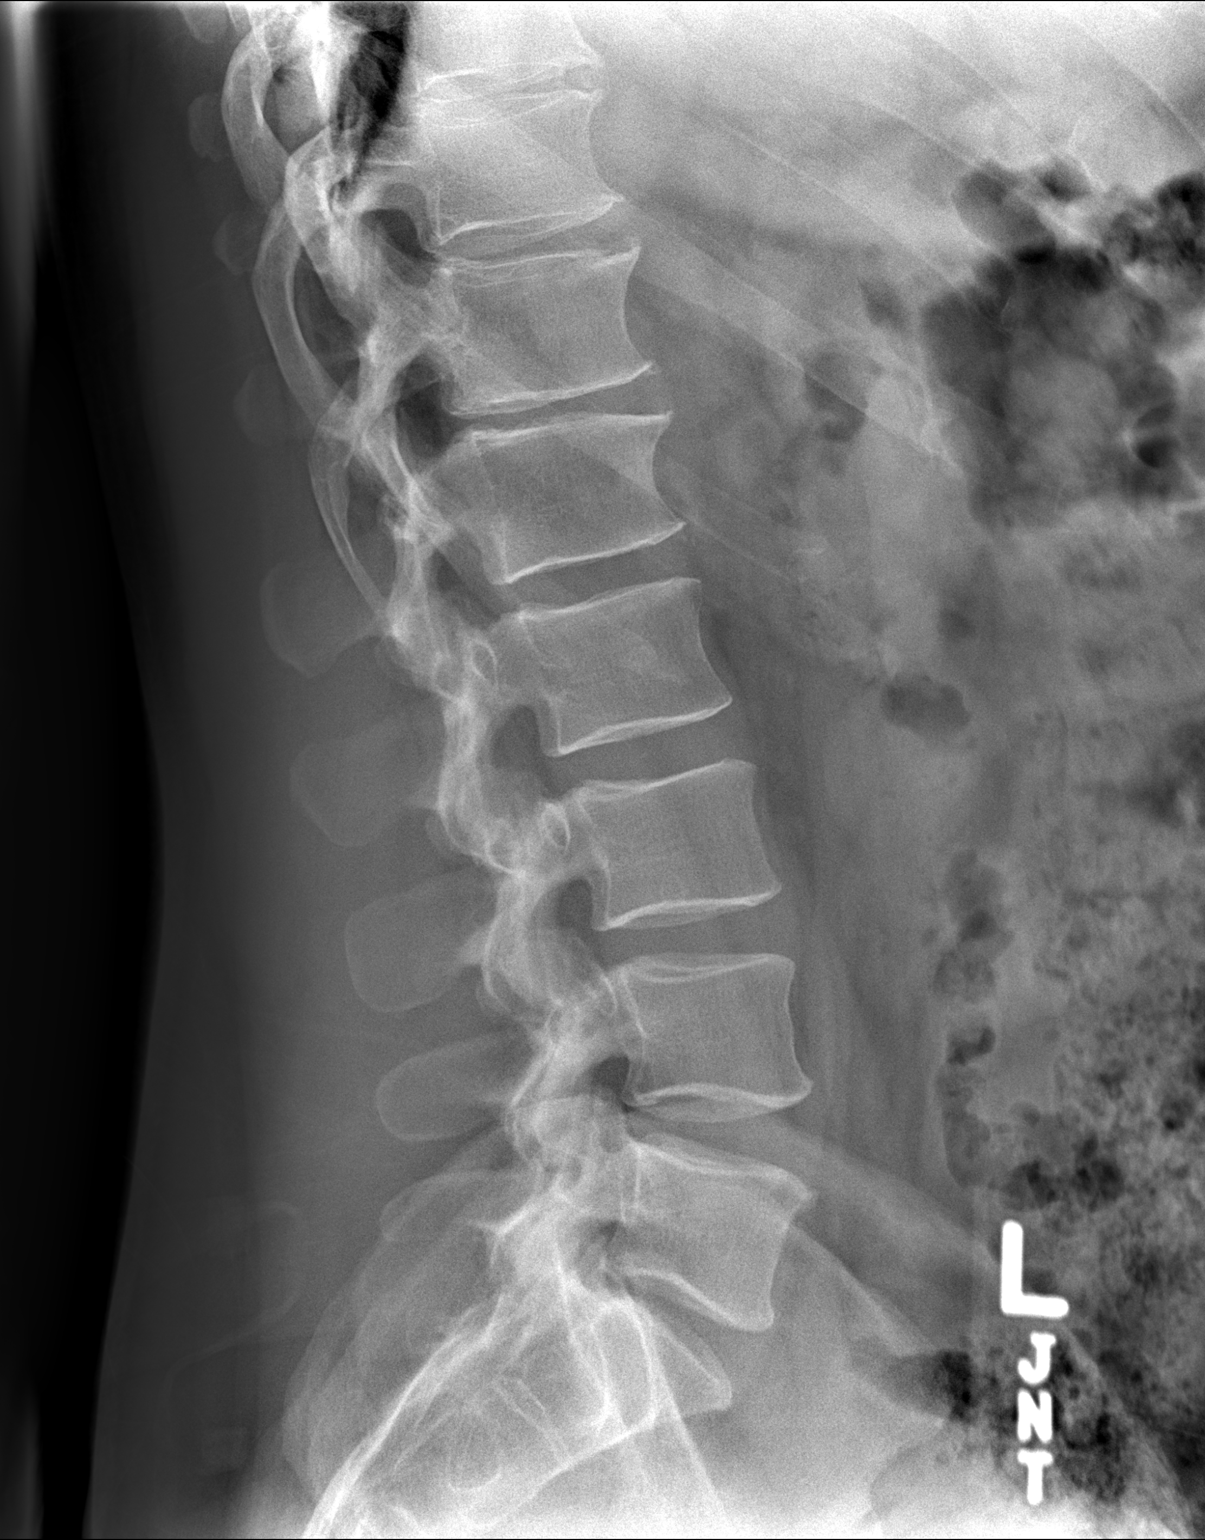

[3 of 3 positions shown; findings below may reference images not displayed]

DIAGNOSTIC STUDIES

EXAM

XR lumbar spine 2-3V

INDICATION

PAIN
Pt c/o left foot pain x 3-4 years, pain and swelling on medial side, pt states the pain goes up the
left side of his body into his shoulder. JT

TECHNIQUE

Three views

COMPARISONS

None

FINDINGS

There is no fracture or listhesis of the lumbar spine. Normal osseous mineralization. There is
degenerative disc disease seen primarily in the lower thoracic spine and at T12-L1. Lumbar interv
ertebral disc heights are relatively well maintained.

IMPRESSION

No acute osseous findings. Degenerative disc disease in the lower thoracic spine.

Tech Notes:

Pt c/o left foot pain x 3-4 years, pain and swelling on medial side, pt states the pain goes up the
left side of his body into his shoulder. JT

## 2021-06-22 IMAGING — CR [ID]
1 series · 1 of 1 positions shown · non-contrast
Comparison: none

[x chest ap]
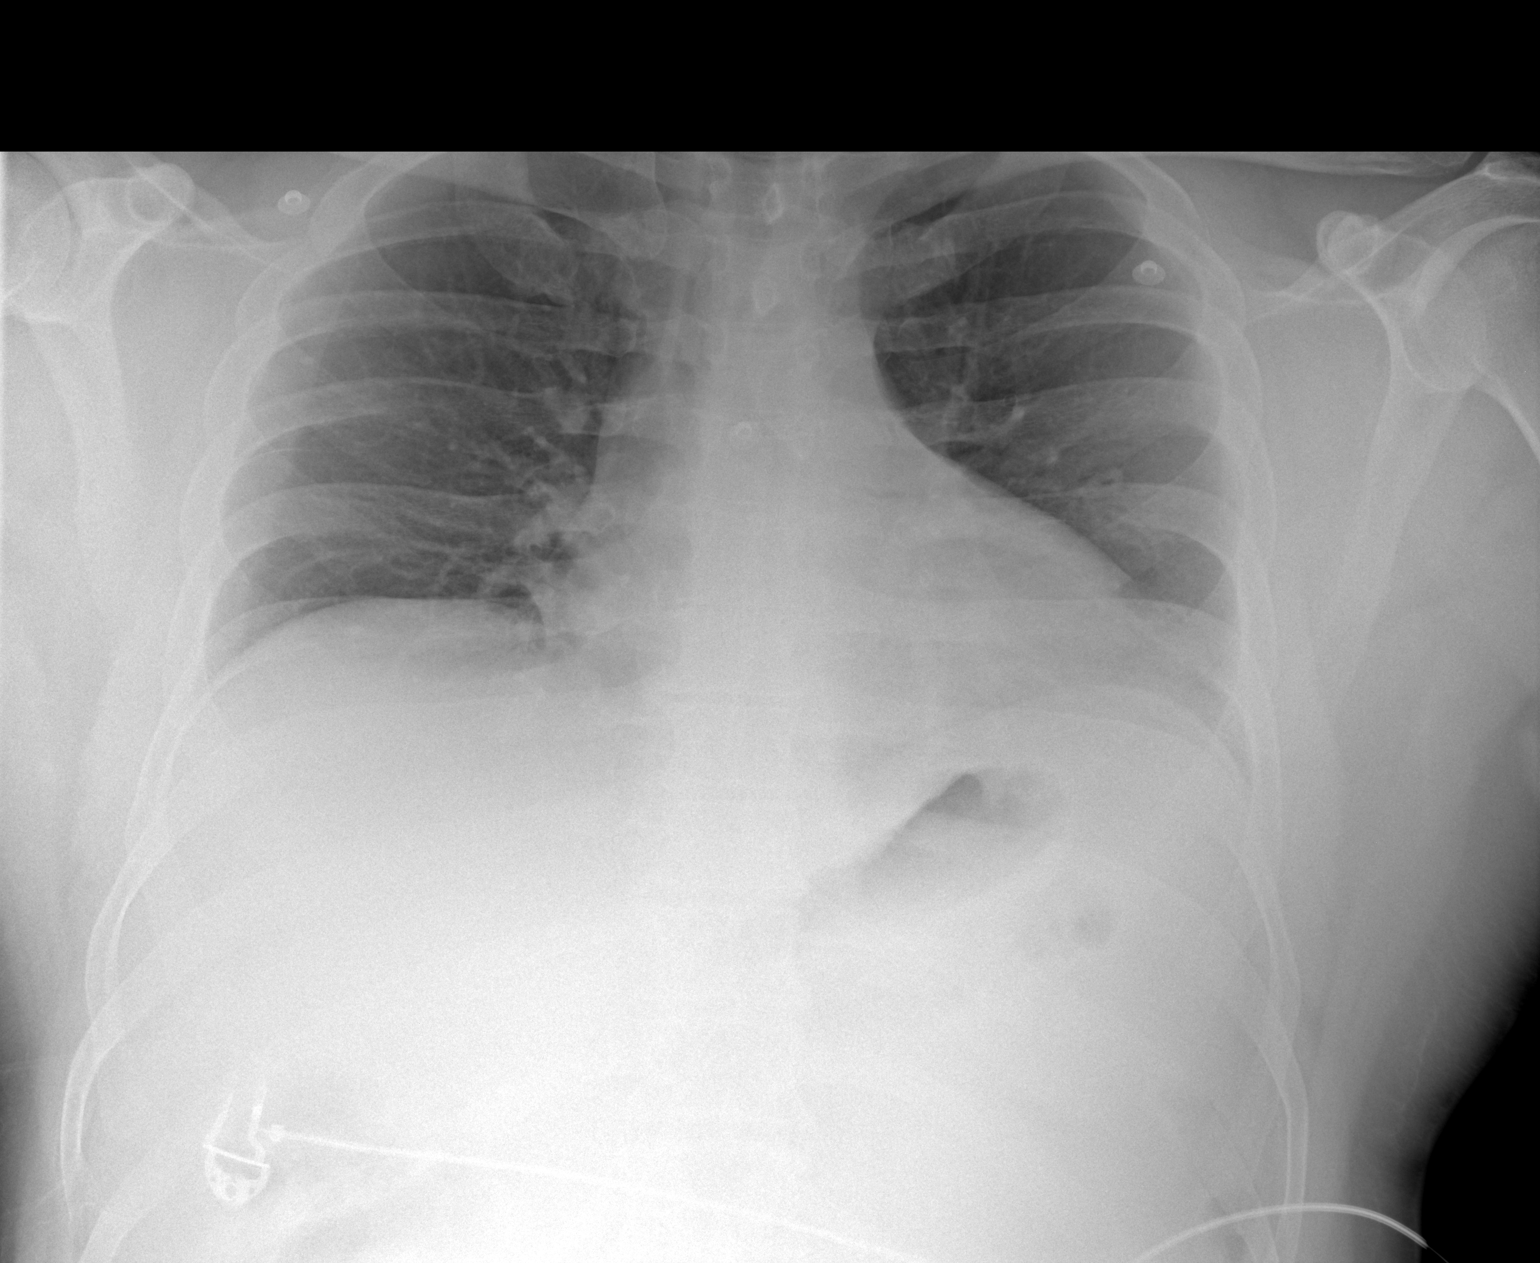

[1 of 1 positions shown; findings below may reference images not displayed]

EXAM

XR chest 1V

INDICATION

chest pain
ACUTE ONSET LEFT SIDED CHEST PAIN X 2-3 HOURS, NO CARDIAC HX PER PT, HX ASTHMA.  RG

TECHNIQUE

XR chest 1V

COMPARISONS

December 10, 2019

FINDINGS

Low lung volumes which exaggerates the cardiac size. No dense consolidation. Pulmonary vasculature
is unremarkable. No pneumothorax. No acute osseous findings.

IMPRESSION

No acute cardiopulmonary findings.

Tech Notes:

ACUTE ONSET LEFT SIDED CHEST PAIN X 2-3 HOURS, NO CARDIAC HX PER PT, HX ASTHMA.  RG

## 2021-11-05 IMAGING — MR SPTHORWO
7 of 11 series · 30 of 48 positions shown · non-contrast
Comparison: none

[Series 34: T2 · axial · 4.0mm · 0.74mm/px · z∈[-43,+110]mm · 4 of 36 slices shown (1 of 4)]
[im 1/36]
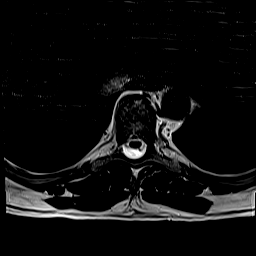
[im 12/36]
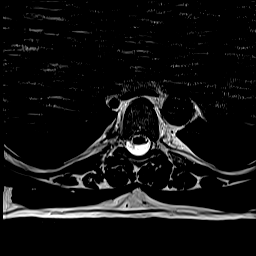
[im 24/36]
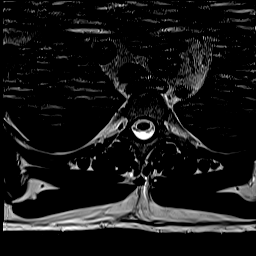
[im 36/36]
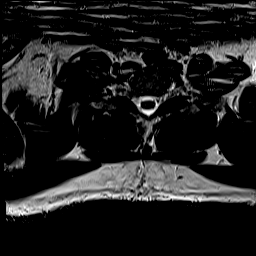

[Series 35: T2 · axial · 4.0mm · 0.74mm/px · z∈[-183,-30]mm · 4 of 36 slices shown (2 of 4)]
[im 1/36]
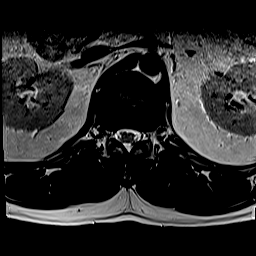
[im 12/36]
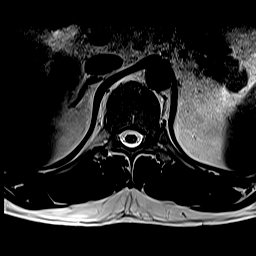
[im 24/36]
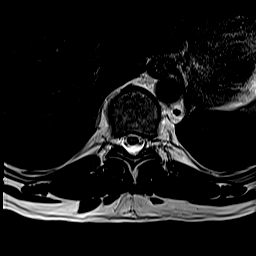
[im 36/36]
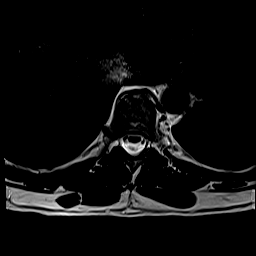

[Series 36: T2 · axial · 4.0mm · 0.74mm/px · z∈[-183,+110]mm · 7 of 72 slices shown (3 of 4)]
[im 1/72]
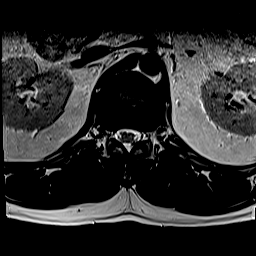
[im 12/72]
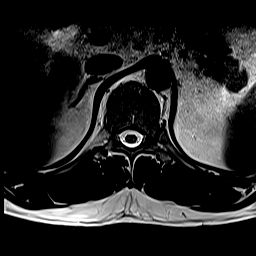
[im 24/72]
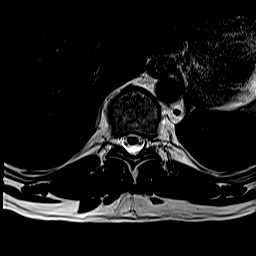
[im 36/72]
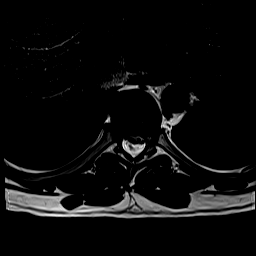
[im 48/72]
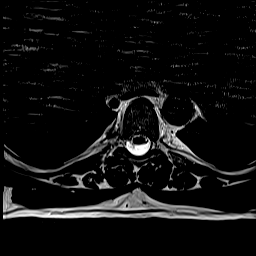
[im 60/72]
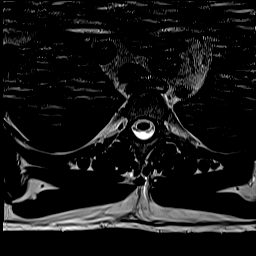
[im 72/72]
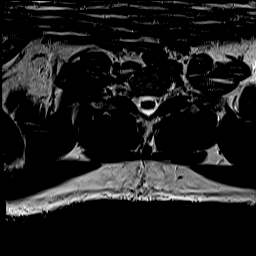

[Series 37: T1 · axial · 4.0mm · 0.37mm/px · z∈[-43,+110]mm · 4 of 36 slices shown (1 of 3)]
[im 1/36]
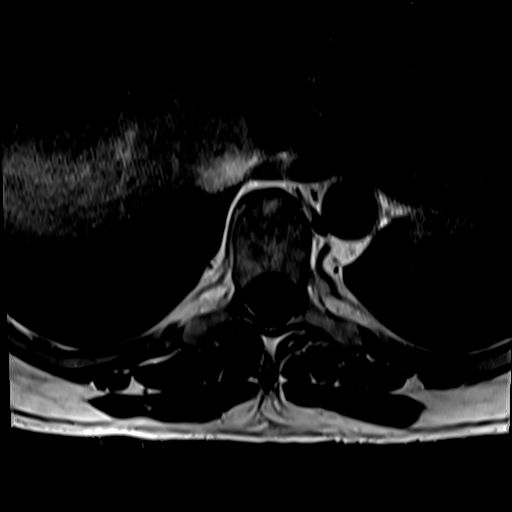
[im 12/36]
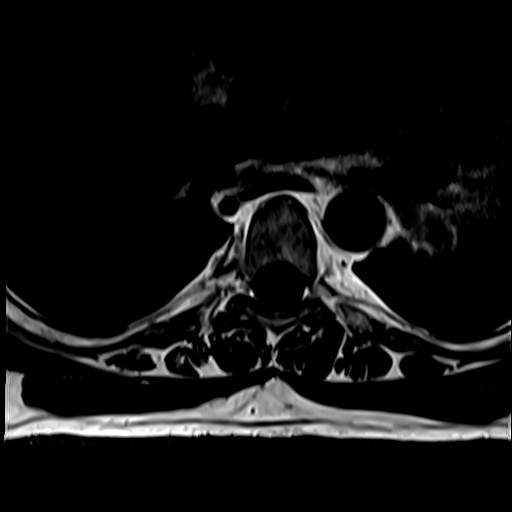
[im 24/36]
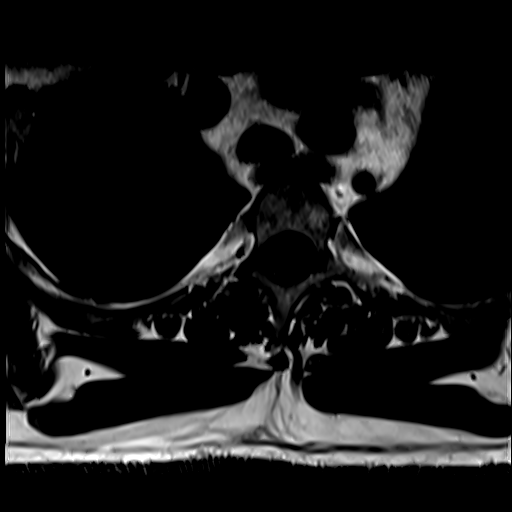
[im 36/36]
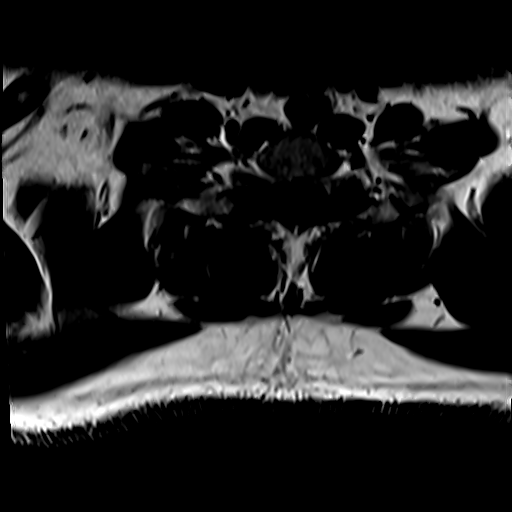

[Series 38: T1 · axial · 4.0mm · 0.37mm/px · z∈[-183,-30]mm · 4 of 36 slices shown (2 of 3)]
[im 1/36]
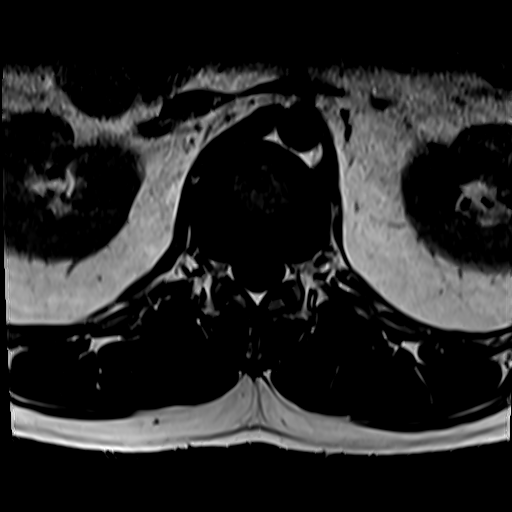
[im 12/36]
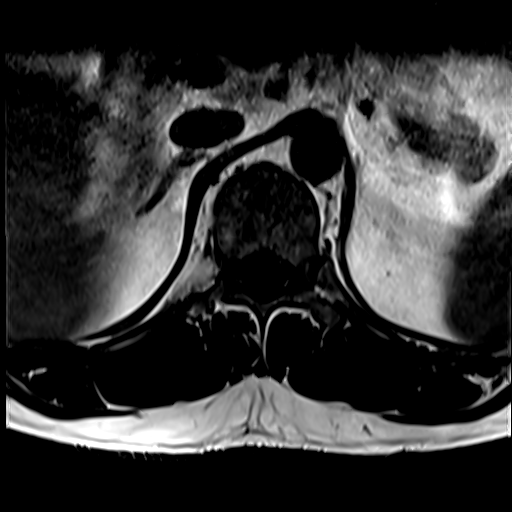
[im 24/36]
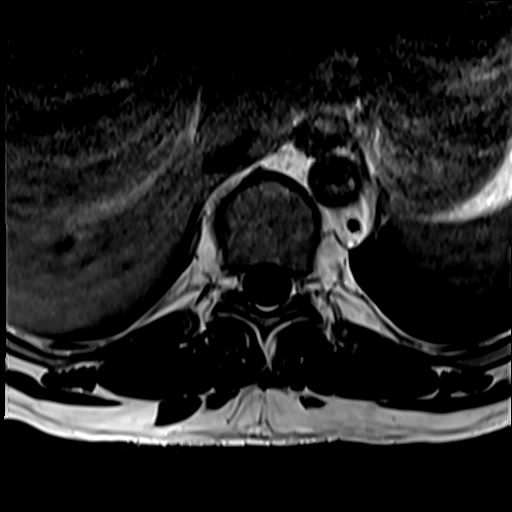
[im 36/36]
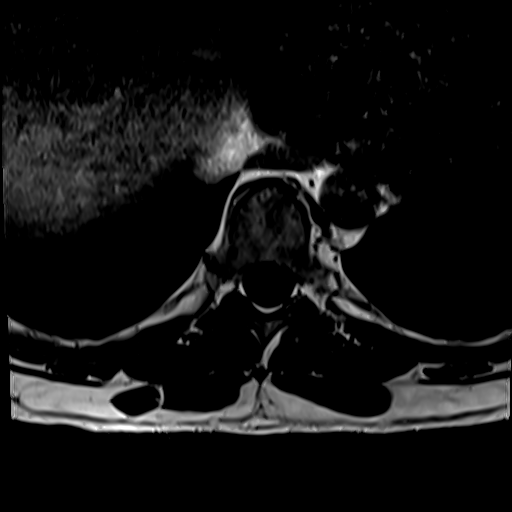

[Series 39: T1 · axial · 4.0mm · 0.37mm/px · z∈[-183,+5]mm · 5 of 72 slices shown (3 of 3)]
[im 1/72]
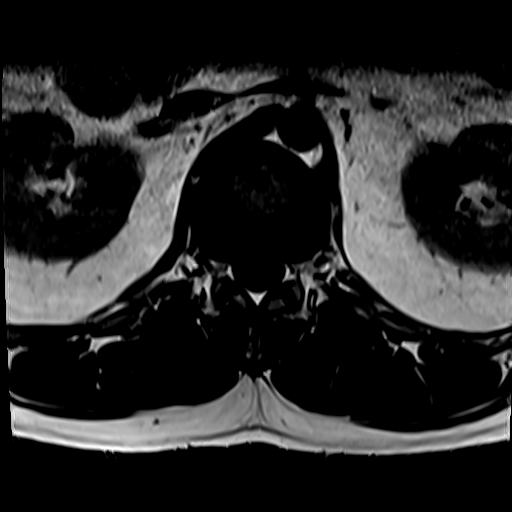
[im 12/72]
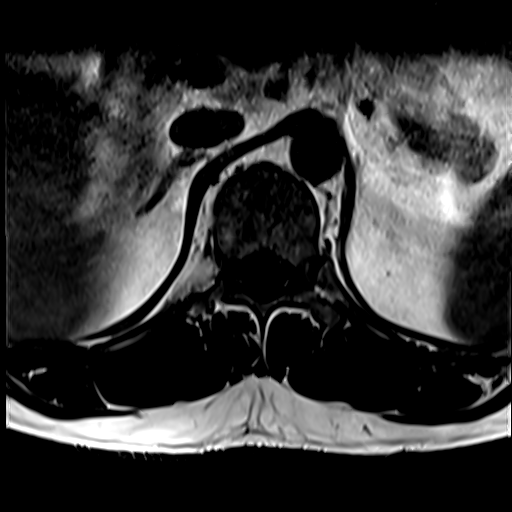
[im 24/72]
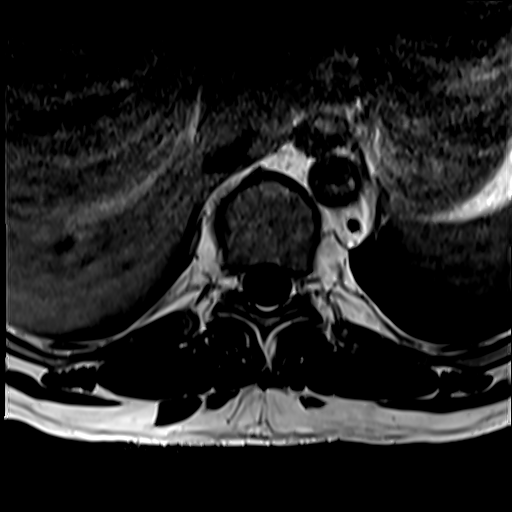
[im 36/72]
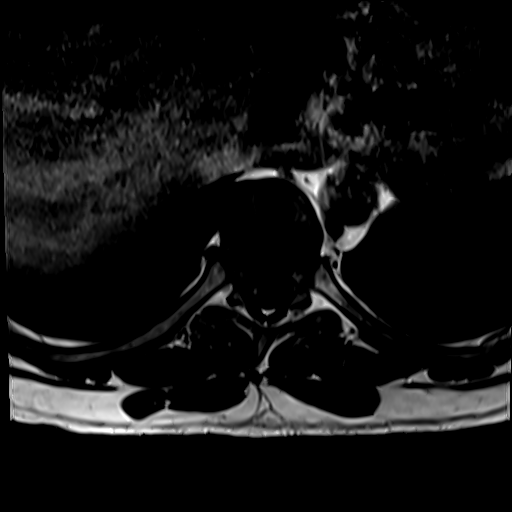
[im 48/72]
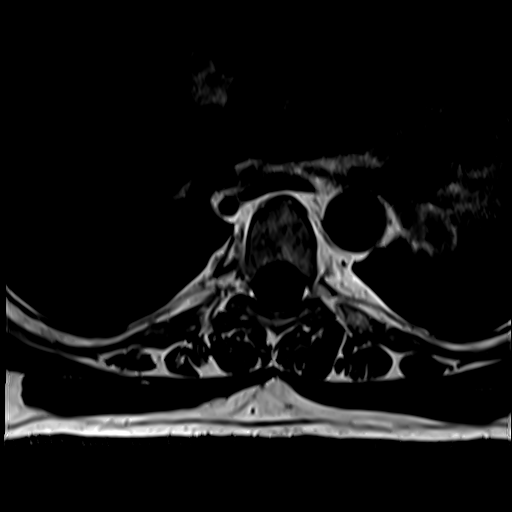

[Series 1027: T2 · sagittal · 3.0mm · 0.89mm/px · 2 of 23 slices shown (4 of 4)]
[im 1/23]
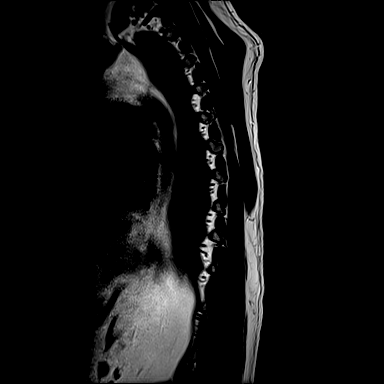
[im 23/23]
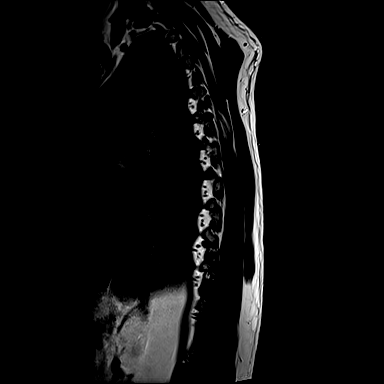

[30 of 48 positions shown; findings below may reference images not displayed]

DIAGNOSTIC STUDIES

EXAM

MAGNETIC RESONANCE IMAGING, SPINAL CANAL AND CONTENTS, LUMBAR; WITHOUT CONTRAST MATERIAL CPT 81542

INDICATION

Low and mid back pain radiating down the legs and left arm. Stroke 4 weeks ago. Right-sided weakness
and numbness.

TECHNIQUE

Routine imaging sequences were obtained on a GE system.

COMPARISONS

X-ray 05/21/2021.

FINDINGS

Thoracic: There is chronic appearing mild anterior wedging of several mid thoracic vertebral bodies.
The marrow signal is slightly heterogeneous but is likely within normal limits. There are no
suspicious marrow lesions. No significant marrow edema. No acute displaced fracture or dislocation.
The spinal cord is normal in signal and caliber. There is no cord compression. Mild degenerative
changes within the mid and lower thoracic spine.

Lumbar:

Minimal convexity to the left. There is normal vertebral height. The marrow signal is slightly
heterogeneous but is likely within normal limits. No suspicious focal marrow lesions. No significant
marrow edema. No acute displaced fracture or dislocation. The conus is located at the level of L1/2.

L1-L2: No significant disc bulge or protrusion.

L2-L3: No significant disc bulge or protrusion.

L3-L4: No significant disc bulge or protrusion.

L4-L5: Mild diffuse disc bulge with mild ligamentum flava hypertrophy and facet arthropathy. No
central canal or neural foramen stenosis.

L5-S1: Diffuse disc bulge with a right paracentral component. There is mild ligamentum flava
hypertrophy and facet arthropathy. Mild central canal stenosis. No neural foramen stenosis.

No enlarged retroperitoneal nodes. No paraspinal fluid collections. Paravertebral soft tissues are
not remarkable.

IMPRESSION

Mild degenerative changes at the level of L5/S1.

Tech Notes:

PAIN TO LOW AND MID BACK WITH RADIATION DOWN LEGS AND LEFT ARM.  PT DID HAVE A STROKE 4 WEEKS AGO
WITH RT SIDED WEAKNESS/ NUMBNESS.  RG

## 2021-11-05 IMAGING — MR SPLUMBWO
9 of 12 series · 28 of 48 positions shown · non-contrast
Comparison: none

[Series 5: T2 · sagittal · 4.0mm · 0.68mm/px · 2 of 17 slices shown (1 of 4)]
[im 1/17]
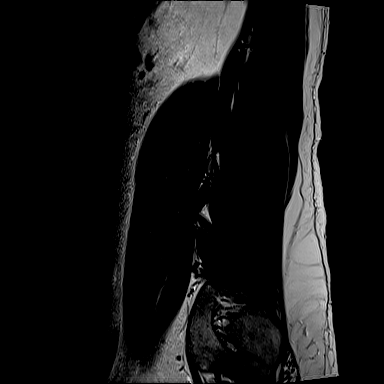
[im 17/17]
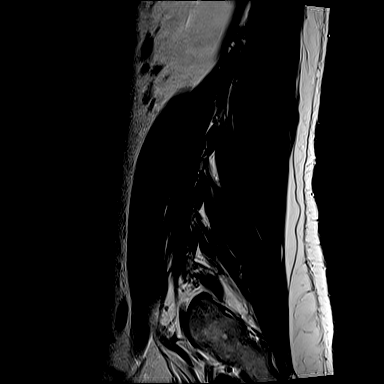

[Series 6: T1 · sagittal · 4.0mm · 0.81mm/px · 2 of 17 slices shown (1 of 4)]
[im 1/17]
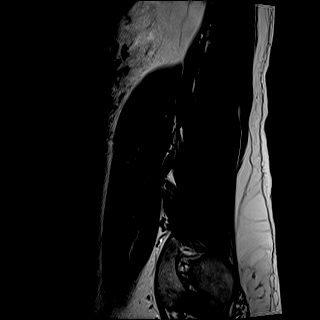
[im 17/17]
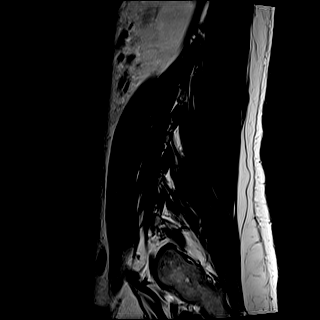

[Series 7: STIR · sagittal · 4.0mm · 0.51mm/px · 2 of 17 slices shown]
[im 1/17]
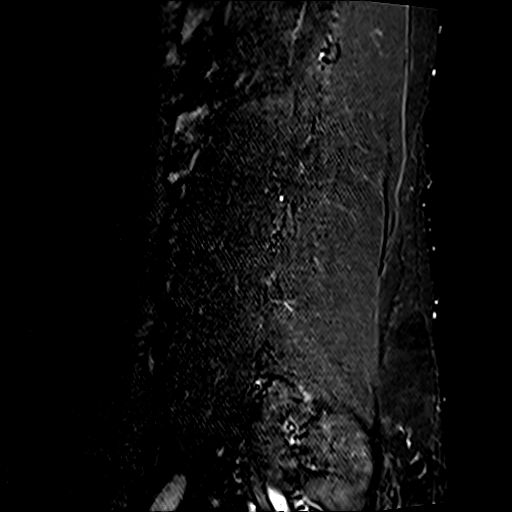
[im 17/17]
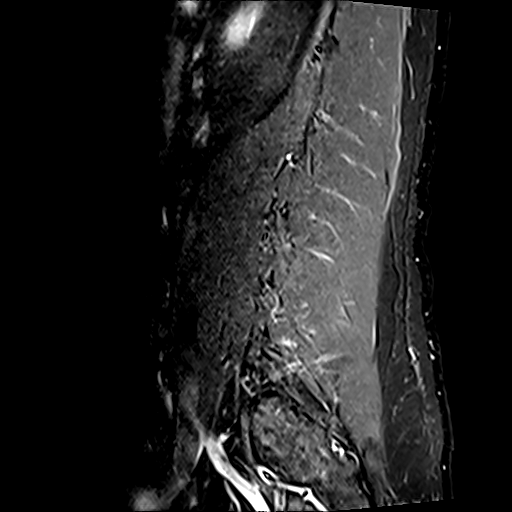

[Series 8: T2 · axial · 4.5mm · 0.81mm/px · z∈[-122,+44]mm · 5 of 32 slices shown (2 of 4)]
[im 1/32]
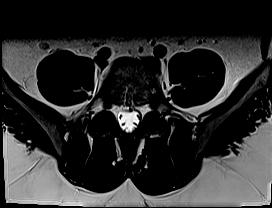
[im 8/32]
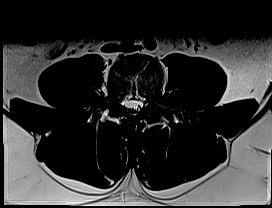
[im 16/32]
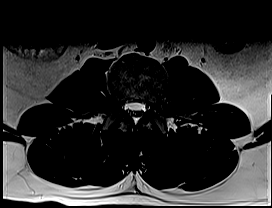
[im 24/32]
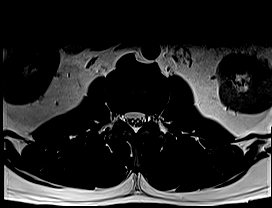
[im 32/32]
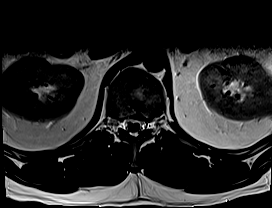

[Series 9: T2 · axial · 4.5mm · 0.81mm/px · 1 of 6 slices shown (3 of 4)]
[im 1/6]
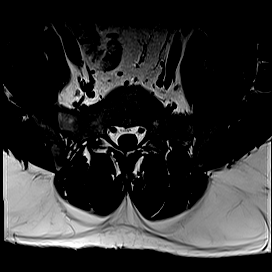

[Series 10: T2 · axial · 4.5mm · 0.81mm/px · z∈[-213,+44]mm · 5 of 36 slices shown (4 of 4)]
[im 1/36]
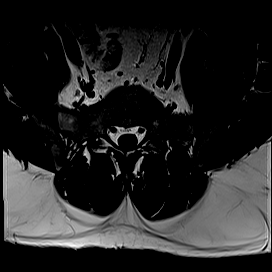
[im 9/36]
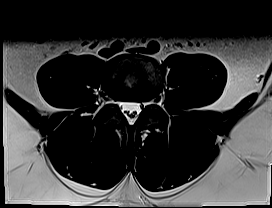
[im 18/36]
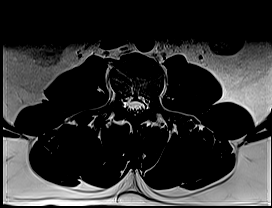
[im 27/36]
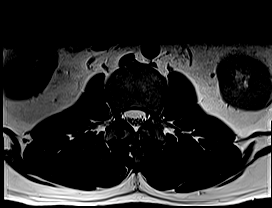
[im 36/36]
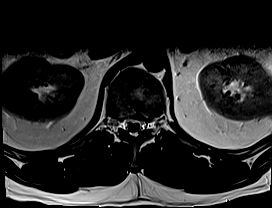

[Series 11: T1 · axial · 4.5mm · 0.43mm/px · z∈[-123,+44]mm · 5 of 32 slices shown (2 of 4)]
[im 1/32]
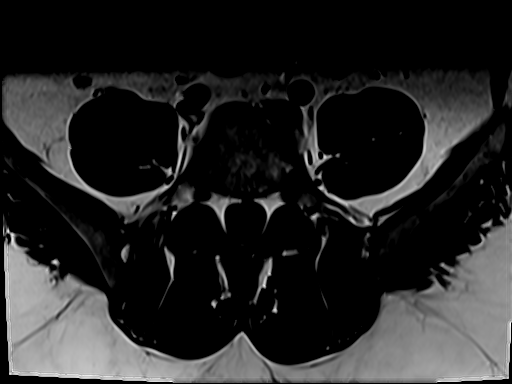
[im 8/32]
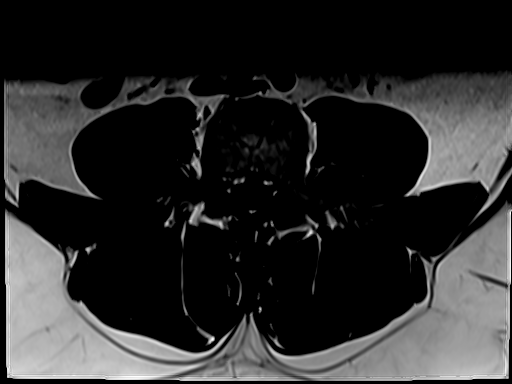
[im 16/32]
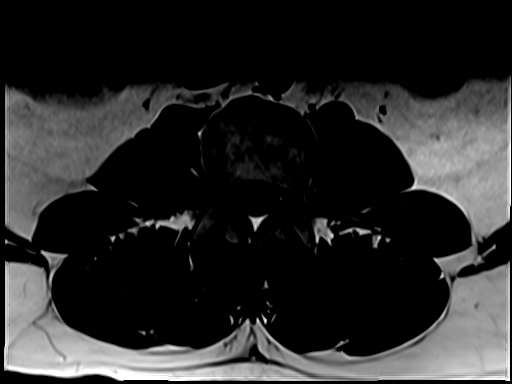
[im 24/32]
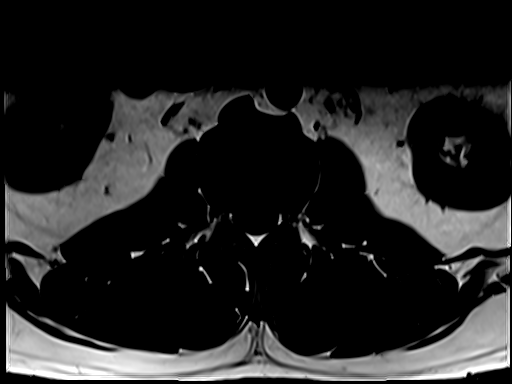
[im 32/32]
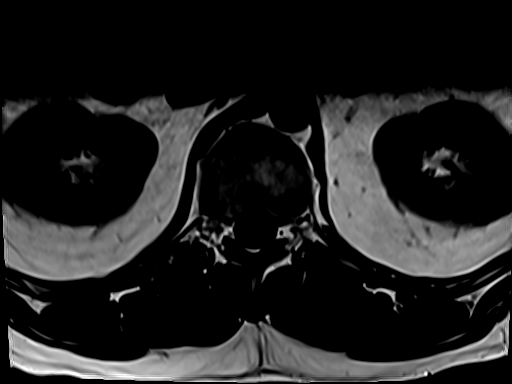

[Series 12: T1 · axial · 4.5mm · 0.43mm/px · 1 of 6 slices shown (3 of 4)]
[im 1/6]
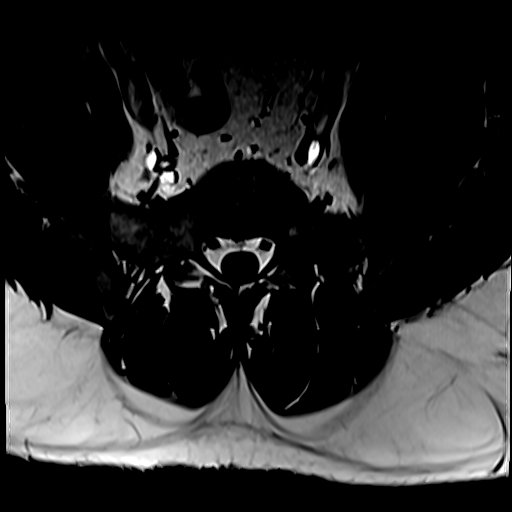

[Series 13: T1 · axial · 4.5mm · 0.43mm/px · z∈[-213,+44]mm · 5 of 36 slices shown (4 of 4)]
[im 1/36]
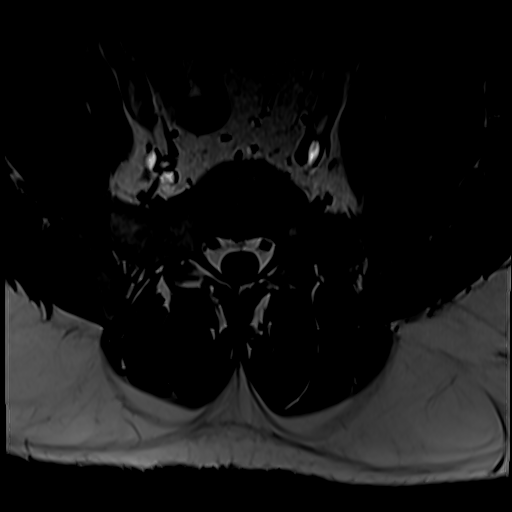
[im 9/36]
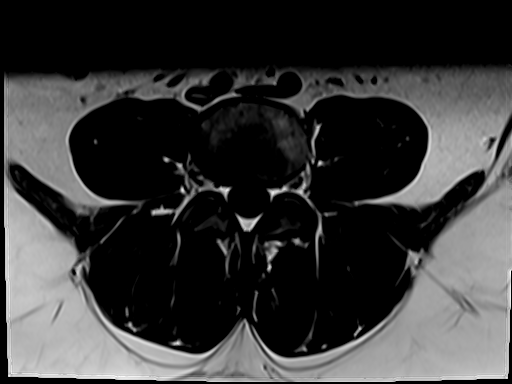
[im 18/36]
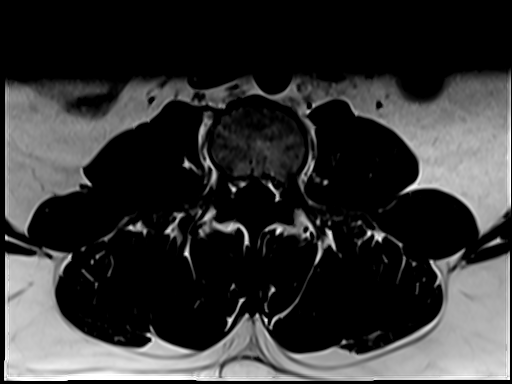
[im 27/36]
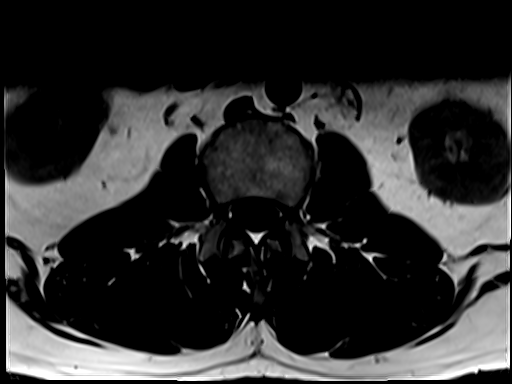
[im 36/36]
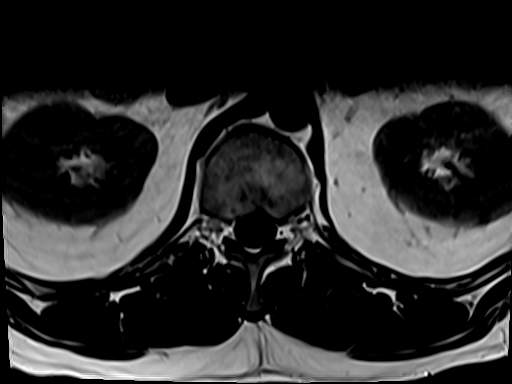

[28 of 48 positions shown; findings below may reference images not displayed]

DIAGNOSTIC STUDIES

EXAM

MAGNETIC RESONANCE IMAGING, SPINAL CANAL AND CONTENTS, LUMBAR; WITHOUT CONTRAST MATERIAL CPT 81542

INDICATION

Low and mid back pain radiating down the legs and left arm. Stroke 4 weeks ago. Right-sided weakness
and numbness.

TECHNIQUE

Routine imaging sequences were obtained on a GE system.

COMPARISONS

X-ray 05/21/2021.

FINDINGS

Thoracic: There is chronic appearing mild anterior wedging of several mid thoracic vertebral bodies.
The marrow signal is slightly heterogeneous but is likely within normal limits. There are no
suspicious marrow lesions. No significant marrow edema. No acute displaced fracture or dislocation.
The spinal cord is normal in signal and caliber. There is no cord compression. Mild degenerative
changes within the mid and lower thoracic spine.

Lumbar:

Minimal convexity to the left. There is normal vertebral height. The marrow signal is slightly
heterogeneous but is likely within normal limits. No suspicious focal marrow lesions. No significant
marrow edema. No acute displaced fracture or dislocation. The conus is located at the level of L1/2.

L1-L2: No significant disc bulge or protrusion.

L2-L3: No significant disc bulge or protrusion.

L3-L4: No significant disc bulge or protrusion.

L4-L5: Mild diffuse disc bulge with mild ligamentum flava hypertrophy and facet arthropathy. No
central canal or neural foramen stenosis.

L5-S1: Diffuse disc bulge with a right paracentral component. There is mild ligamentum flava
hypertrophy and facet arthropathy. Mild central canal stenosis. No neural foramen stenosis.

No enlarged retroperitoneal nodes. No paraspinal fluid collections. Paravertebral soft tissues are
not remarkable.

IMPRESSION

Mild degenerative changes at the level of L5/S1.

Tech Notes:

PAIN TO LOW AND MID BACK WITH RADIATION DOWN LEGS AND LEFT ARM.  PT DID HAVE A STROKE 4 WEEKS AGO
WITH RT SIDED WEAKNESS/ NUMBNESS.  RG

## 2021-11-17 IMAGING — CT BRAIN WO(Adult)
3 of 4 series · 14 of 47 positions shown, 16 images · non-contrast
Comparison: none

[Series 4: brain cor 5.00 hr40 s3 · coronal · 0.31mm/px · 3 of 42 slices shown]
[im 14/42  brain]
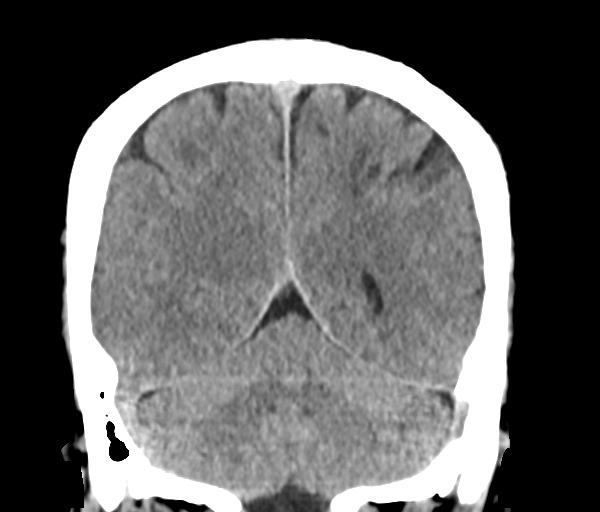
[im 19/42  brain]
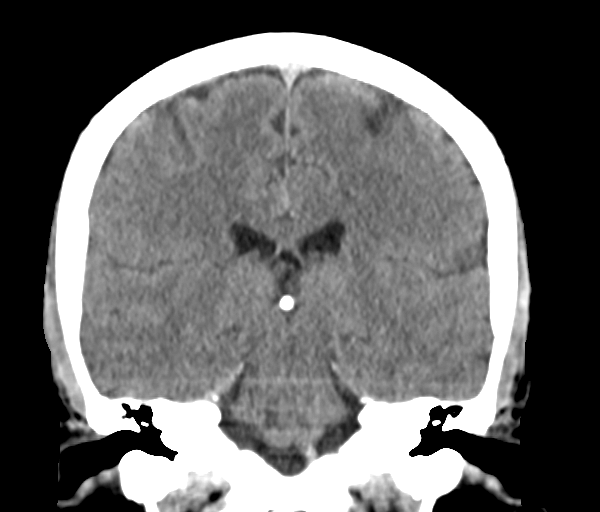
[im 23/42  brain]
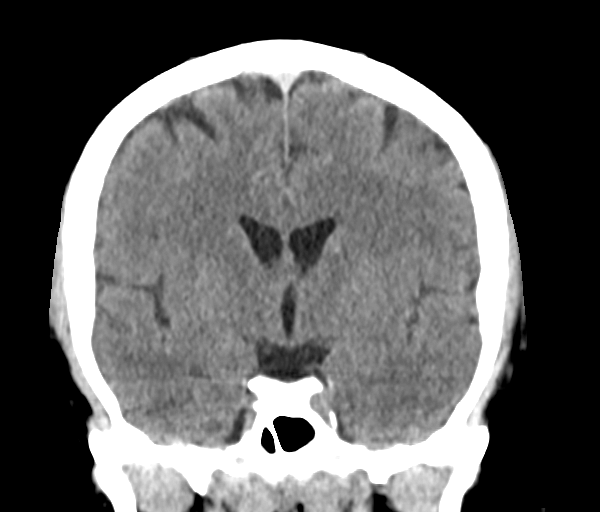

[Series 6: brain sag 5.00 hr40 s3 · sagittal · 0.31mm/px · 3 of 37 slices shown]
[im 13/37  brain]
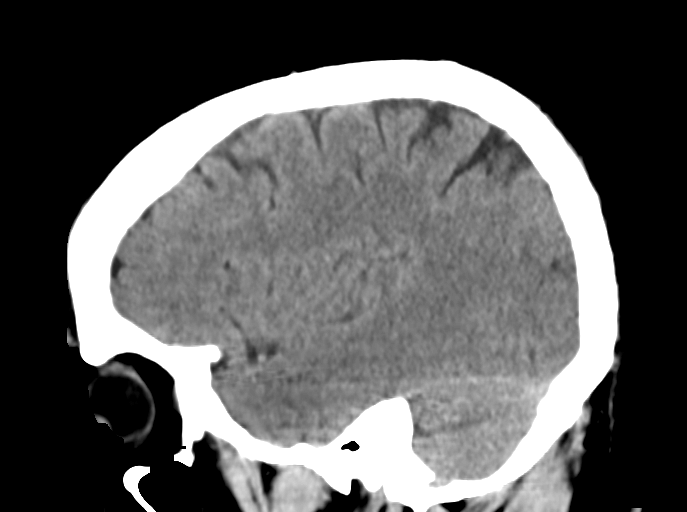
[im 19/37  brain]
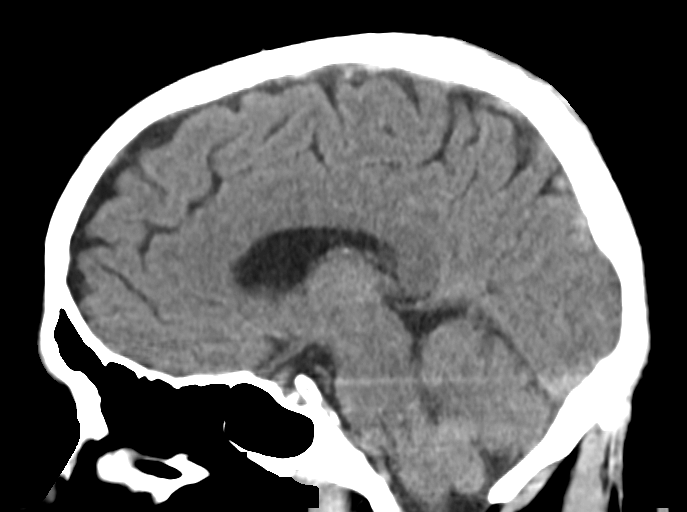
[im 25/37  brain]
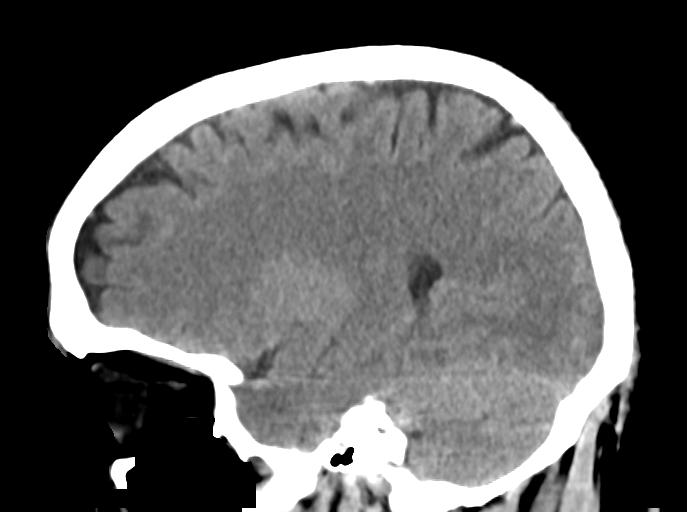

[Series 8: brain ax 2.00 hr60 s3 · axial · 0.37mm/px · z∈[-523,-389]mm · 8 of 83 slices shown, 10 images]
[im 8/83  brain]
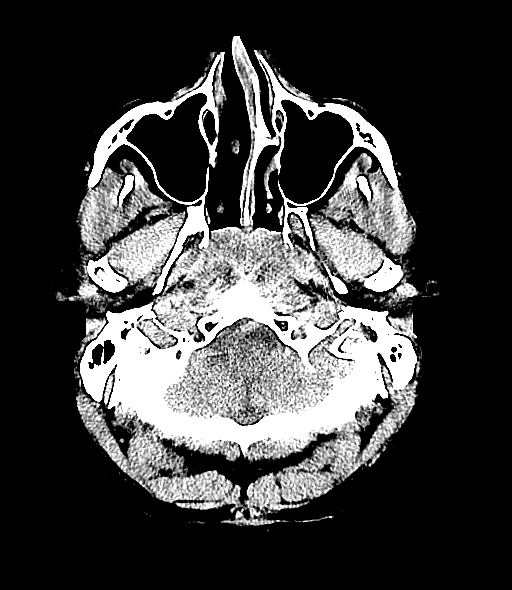
[im 8/83  bone]
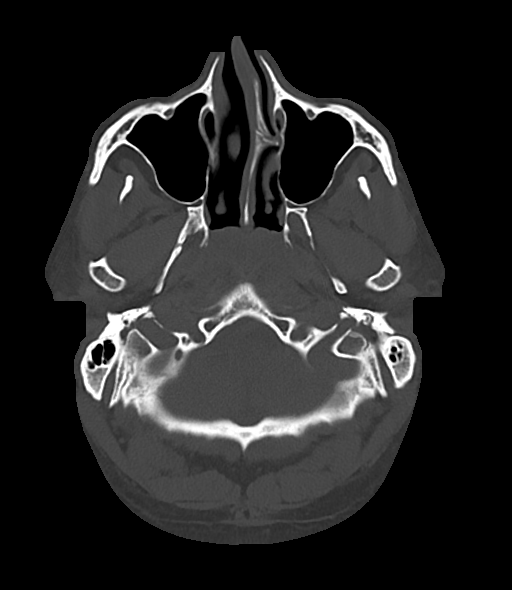
[im 16/83  brain]
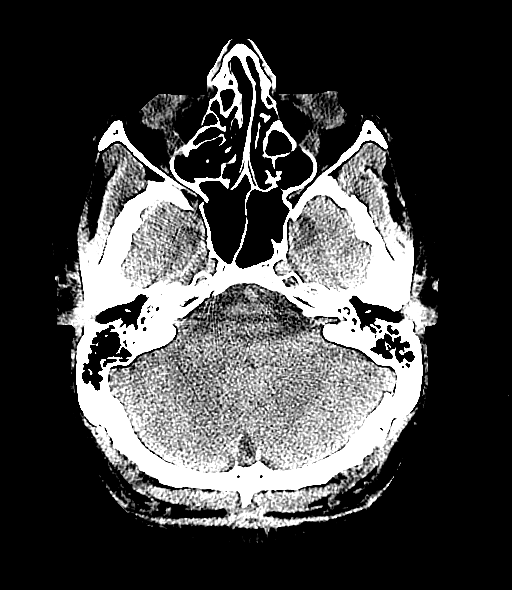
[im 28/83  brain]
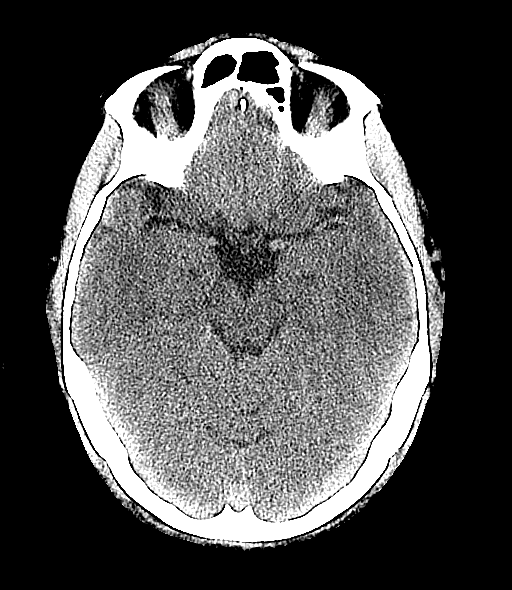
[im 36/83  brain]
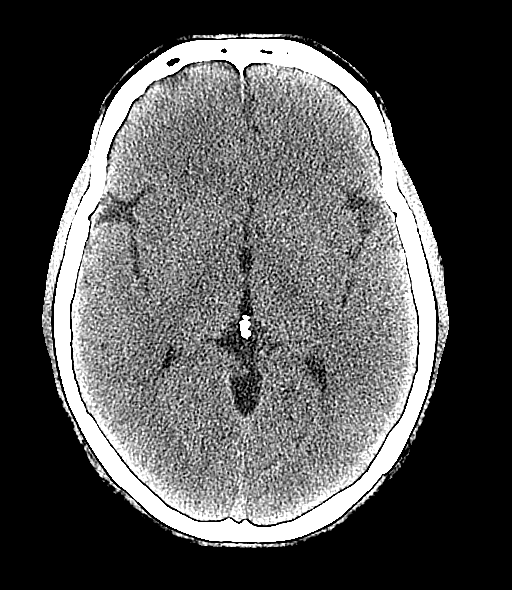
[im 47/83  brain]
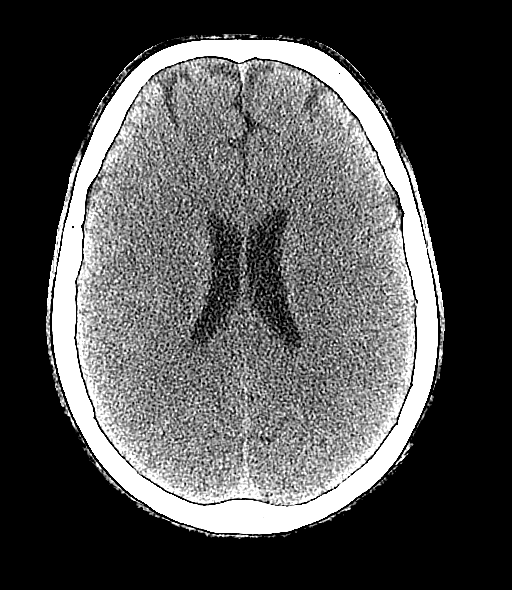
[im 47/83  bone]
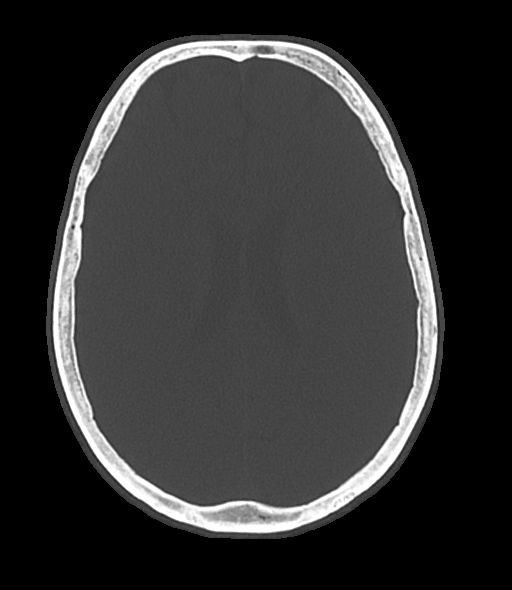
[im 55/83  brain]
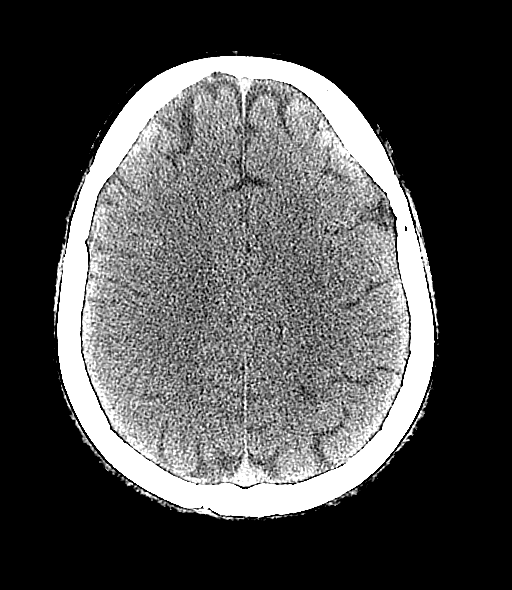
[im 67/83  brain]
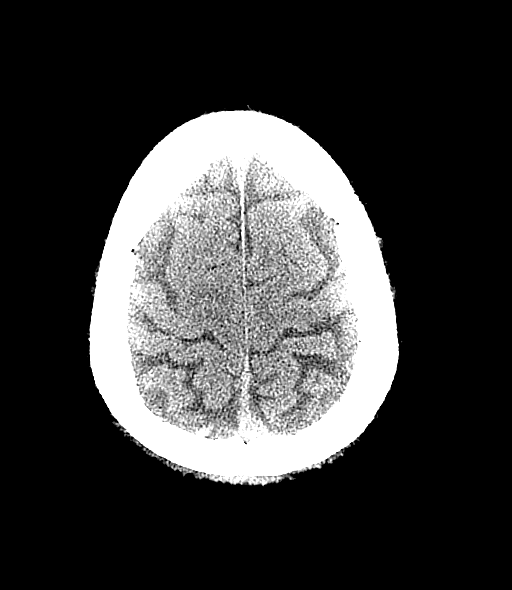
[im 75/83  brain]
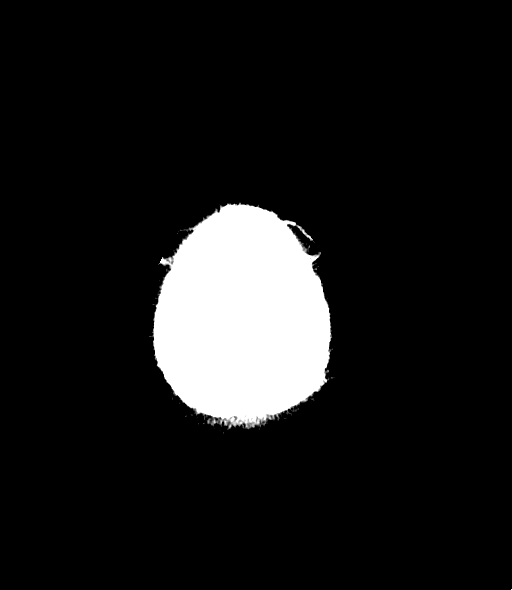

[14 of 47 positions shown; findings below may reference images not displayed]

DIAGNOSTIC STUDIES

EXAM

CT of the head without IV contrast

INDICATION

Stroke protocol
LT SIDE DROOP, DIFFICULTY WITH SPEECH.  HX. POSSIBLE STROKE IN THE PAST.  AB

TECHNIQUE

All CT scans at this facility use dose modulation, iterative reconstruction, and/or weight based
dosing when appropriate to reduce radiation dose to as low as reasonably achievable.

Number of previous computed tomography exams in the last 12 months is 0  .

Number of previous nuclear medicine myocardial perfusion studies in the last 12 months is 0  .

COMPARISONS

May 06, 2016

FINDINGS

No intracranial hemorrhage. No mass effect or cerebral herniation. The gray-white matter interface
appears maintained. Ventricles are normal in size and configuration. No depressed calvarial
fracture. Mastoid air cells and paranasal sinuses are clear.

Findings discussed with Juli Damron by myself at [DATE] a.m.   11/17/2021.

IMPRESSION

No intracranial hemorrhage. No acute intracranial findings.

Consider MRI for further evaluation.

Tech Notes:

LT SIDE DROOP, DIFFICULTY WITH SPEECH.  HX. POSSIBLE STROKE IN THE PAST.  AB

## 2022-01-10 ENCOUNTER — Encounter: Admit: 2022-01-10 | Discharge: 2022-01-10 | Payer: BC Managed Care – PPO

## 2022-01-10 NOTE — Progress Notes
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
